# Patient Record
Sex: Female | Born: 1960 | Race: White | Hispanic: No | Marital: Married | Smoking: Never smoker
Health system: Southern US, Community
[De-identification: ages and names within clinical notes are randomized; demographics above are authoritative.]

## PROBLEM LIST (undated history)

## (undated) DIAGNOSIS — D649 Anemia, unspecified: Secondary | ICD-10-CM

## (undated) DIAGNOSIS — E119 Type 2 diabetes mellitus without complications: Secondary | ICD-10-CM

## (undated) DIAGNOSIS — C801 Malignant (primary) neoplasm, unspecified: Secondary | ICD-10-CM

## (undated) DIAGNOSIS — G4730 Sleep apnea, unspecified: Secondary | ICD-10-CM

## (undated) DIAGNOSIS — E039 Hypothyroidism, unspecified: Secondary | ICD-10-CM

## (undated) DIAGNOSIS — R51 Headache: Secondary | ICD-10-CM

## (undated) DIAGNOSIS — I10 Essential (primary) hypertension: Secondary | ICD-10-CM

## (undated) HISTORY — DX: Malignant (primary) neoplasm, unspecified: C80.1

## (undated) HISTORY — PX: TONSILLECTOMY: SUR1361

## (undated) HISTORY — DX: Sleep apnea, unspecified: G47.30

## (undated) HISTORY — PX: OTHER SURGICAL HISTORY: SHX169

## (undated) HISTORY — DX: Type 2 diabetes mellitus without complications: E11.9

## (undated) HISTORY — DX: Anemia, unspecified: D64.9

## (undated) HISTORY — PX: TUBAL LIGATION: SHX77

## (undated) HISTORY — DX: Essential (primary) hypertension: I10

## (undated) HISTORY — PX: ABDOMINAL HYSTERECTOMY: SHX81

---

## 2013-02-08 ENCOUNTER — Ambulatory Visit (INDEPENDENT_AMBULATORY_CARE_PROVIDER_SITE_OTHER): Payer: 59 | Admitting: Surgery

## 2013-02-08 ENCOUNTER — Encounter (INDEPENDENT_AMBULATORY_CARE_PROVIDER_SITE_OTHER): Payer: Self-pay | Admitting: Surgery

## 2013-02-08 ENCOUNTER — Other Ambulatory Visit (INDEPENDENT_AMBULATORY_CARE_PROVIDER_SITE_OTHER): Payer: Self-pay

## 2013-02-08 DIAGNOSIS — E119 Type 2 diabetes mellitus without complications: Secondary | ICD-10-CM

## 2013-02-08 DIAGNOSIS — G4733 Obstructive sleep apnea (adult) (pediatric): Secondary | ICD-10-CM

## 2013-02-08 DIAGNOSIS — I10 Essential (primary) hypertension: Secondary | ICD-10-CM

## 2013-02-08 DIAGNOSIS — Z6841 Body Mass Index (BMI) 40.0 and over, adult: Secondary | ICD-10-CM

## 2013-02-08 NOTE — Progress Notes (Signed)
Re:   Ann Hendricks DOB:   04/12/1960 MRN:   [id number]  ASSESSMENT AND PLAN: 1.  Morbid obesity  Initial weight - weight - 282, BMI - 44.8  Per the [facility], the patient is a candidate for bariatric surgery.  The patient attended our information session and reviewed the different types of bariatric surgery.    The patient is interested in the laparoscopic adjustable gastric band.  I discussed with the patient the indications and risks of lap band surgery.  The potential risks of surgery include, but are not limited to, bleeding, infection, DVT and PE, slippage and erosion of the band, open surgery, and death.  The patient understands the importance of compliance and long term follow-up with our group after surgery.  She was given literature regarding lap band surgery.  From here we'll obtain labs, x-rays, nutrition consult, and psych consult.  2.  Diabetes x 5 years  HgbA1C - 7.1 - 01/16/2013 3.  HTN 4.  Sleep apnea x 10 years  On CPAP  She sees a Dr. Alroy Dust in [place]. 5.  History of endometrial cancer - 2013 - Stage 1  Disease free 6.  Hypothyroid x 20 years  Chief Complaint  Patient presents with  . Weight Loss Surgery    initial consult / lap band   REFERRING PHYSICIAN: Noel Christmas  HISTORY OF PRESENT ILLNESS: Ann Hendricks is a 53 y.o. (DOB: [birth year])  white  female whose primary care physician is [facility], [place], [place].  FAX: [phone]) and comes to me today for weight loss surgery.  She come from [place], [place], because of her insurance.  The patient listened to on line seminar.  She and her husband complimented of the seminar. Her sister has had the lap band about one year ago and has done well with this. She has tried many diets including: Weight Watchers, TOPS, liquid protein, low calorie diet, Adkins diet, and Adipex. She \\has  even tried hypnosis.  She mentioned that her first diet was when she was 53 yo in the  late 1970's.  She has to complete a 6 month supervised diet by her insurer before surgery.   Past Medical History  Diagnosis Date  . Anemia   . Cancer     endometrial  . Diabetes mellitus without complication   . Hypertension   . Sleep apnea      Past Surgical History  Procedure Laterality Date  . Tonsillectomy    . Tubal ligation    . Abdominal hysterectomy      Current Outpatient Prescriptions  Medication Sig Dispense Refill  . amLODipine (NORVASC) 10 MG tablet       . aspirin 81 MG tablet Take 81 mg by mouth daily.      Marland Kitchen FREESTYLE INSULINX TEST test strip       . glyBURIDE micronized (GLYNASE) 3 MG tablet       . levothyroxine (SYNTHROID, LEVOTHROID) 200 MCG tablet       . losartan (COZAAR) 50 MG tablet       . metFORMIN (GLUCOPHAGE) 500 MG tablet       . norethindrone (AYGESTIN) 5 MG tablet       . PARoxetine (PAXIL) 20 MG tablet        No current facility-administered medications for this visit.     Allergies  Allergen Reactions  . Penicillins Hives    REVIEW OF SYSTEMS: Skin:  No history of rash.  No history of abnormal moles. Infection:  No history of hepatitis or HIV.  No history of MRSA. Neurologic:  No history of stroke.  No history of seizure.  No history of headaches. Cardiac:  HTN x 10 years. Pulmonary:  OSA x 10 years.  Dr. Alroy Dust in [place] follows her.  Endocrine:  Diabetic on oral hypoglycemics x 5 years.. On thyroid replacement x 20 years. Gastrointestinal:  No history of stomach disease.  No history of liver disease.  No history of gall bladder disease.  No history of pancreas disease. Colonoscopy in 2013 which was negative. Urologic:  No history of kidney stones.  No history of bladder infections. GYN:  Robotic hysterectomy 2013 for Stage 1 endometrial cancer.  Her husband's vasectomy did not work, so that is the reason for the trailing two children.  She has recently (last month) been started on hormone replacement because of hot  flashes. Musculoskeletal:  History of plantar fasciitis, which is behaving right now. Hematologic:  No bleeding disorder.  No history of anemia.  Not anticoagulated. Psycho-social:  The patient is oriented.   The patient has no obvious psychologic or social impairment to understanding our conversation and plan.  SOCIAL and FAMILY HISTORY: Married.  Her husband is with her. 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    PHYSICAL EXAM: BP 130/82  Pulse 80  Temp(Src) 98.4 F (36.9 C) (Oral)  Resp 15  Ht 5' 6.5" (1.689 m)  Wt 282 lb (127.914 kg)  BMI 44.84 kg/m2  General: Obese WF who is alert and generally healthy appearing.  HEENT: Normal. Pupils equal. Neck: Supple. No mass.  No thyroid mass. Lymph Nodes:  No supraclavicular or cervical nodes. Lungs: Clear to auscultation and symmetric breath sounds. Heart:  RRR. No murmur or rub. Breast:  Right - slightly smaller than the left.  No mass  Left - no mass  Abdomen: Soft. No mass. No tenderness. No hernia. Obese.  Small umbilical hernia.  Midline scar just below the umbilicus and trocar scars right mid abdomen.  She is more apple that pear. Rectal: Not done. Extremities:  Good strength and ROM  in upper and lower extremities. Neurologic:  Grossly intact to motor and sensory function. Psychiatric: Has normal mood and affect. Behavior is normal.   DATA REVIEWED: Labs from [place].  Alphonsa Overall, MD,  [facility], [address]  [place], [place]    [place] Phone:  [phone] FAX:  [phone]

## 2013-03-04 ENCOUNTER — Ambulatory Visit (HOSPITAL_COMMUNITY)
Admission: RE | Admit: 2013-03-04 | Discharge: 2013-03-04 | Disposition: A | Discharge status: Home / Self Care | Payer: 59 | Source: Ambulatory Visit | Attending: Surgery | Admitting: Surgery

## 2013-03-04 ENCOUNTER — Other Ambulatory Visit: Payer: Self-pay

## 2013-03-04 DIAGNOSIS — R1013 Epigastric pain: Secondary | ICD-10-CM

## 2013-03-04 DIAGNOSIS — G4730 Sleep apnea, unspecified: Secondary | ICD-10-CM | POA: Insufficient documentation

## 2013-03-04 DIAGNOSIS — E119 Type 2 diabetes mellitus without complications: Secondary | ICD-10-CM | POA: Insufficient documentation

## 2013-03-04 DIAGNOSIS — I10 Essential (primary) hypertension: Secondary | ICD-10-CM | POA: Insufficient documentation

## 2013-03-04 DIAGNOSIS — K224 Dyskinesia of esophagus: Secondary | ICD-10-CM | POA: Insufficient documentation

## 2013-03-04 DIAGNOSIS — K5710 Diverticulosis of small intestine without perforation or abscess without bleeding: Secondary | ICD-10-CM | POA: Insufficient documentation

## 2013-03-04 DIAGNOSIS — Z6841 Body Mass Index (BMI) 40.0 and over, adult: Secondary | ICD-10-CM | POA: Insufficient documentation

## 2013-03-04 DIAGNOSIS — E039 Hypothyroidism, unspecified: Secondary | ICD-10-CM | POA: Insufficient documentation

## 2013-03-04 DIAGNOSIS — K3189 Other diseases of stomach and duodenum: Secondary | ICD-10-CM | POA: Insufficient documentation

## 2013-03-06 ENCOUNTER — Ambulatory Visit (HOSPITAL_COMMUNITY): Admission: RE | Admit: 2013-03-06 | Payer: 59 | Source: Ambulatory Visit | Admitting: Surgery

## 2013-03-06 ENCOUNTER — Encounter (HOSPITAL_COMMUNITY): Admission: RE | Payer: Self-pay | Source: Ambulatory Visit

## 2013-03-06 SURGERY — BREATH TEST, FOR HELICOBACTER PYLORI

## 2013-03-16 ENCOUNTER — Ambulatory Visit: Payer: Self-pay | Admitting: Dietician

## 2013-03-27 ENCOUNTER — Ambulatory Visit: Payer: Self-pay | Admitting: Dietician

## 2013-04-02 ENCOUNTER — Encounter: Payer: Self-pay | Admitting: Dietician

## 2013-04-02 ENCOUNTER — Encounter: Payer: 59 | Attending: Surgery | Admitting: Dietician

## 2013-04-02 DIAGNOSIS — Z713 Dietary counseling and surveillance: Secondary | ICD-10-CM | POA: Insufficient documentation

## 2013-04-02 NOTE — Patient Instructions (Signed)
Patient to call the [facility] to enroll in Pre-Op and Post-Op Nutrition Education when surgery date is scheduled. 

## 2013-04-02 NOTE — Progress Notes (Signed)
  Pre-Op Assessment Visit:  Pre-Operative LAGB Surgery  Medical Nutrition Therapy:  Appt start time: 7858   End time:  8502.  Patient was seen on 04/02/2013 for Pre-Operative LAGB Nutrition Assessment. Assessment and letter of approval faxed to [facility] Bariatric Surgery Program coordinator on 04/02/2013.   Preferred Learning Style:   No preference indicated   Learning Readiness:   Contemplating  Handouts given during visit include:  Pre-Op Goals Bariatric Surgery Protein Shakes Pre-op diet (patient requested)  Teaching Method Utilized:  Visual Auditory Hands on  Barriers to learning/adherence to lifestyle change: none  Demonstrated degree of understanding via:  Teach Back   Patient to call the [facility] to enroll in Pre-Op and Post-Op Nutrition Education when surgery date is scheduled.

## 2013-05-08 ENCOUNTER — Telehealth (INDEPENDENT_AMBULATORY_CARE_PROVIDER_SITE_OTHER): Payer: Self-pay

## 2013-05-08 ENCOUNTER — Other Ambulatory Visit (INDEPENDENT_AMBULATORY_CARE_PROVIDER_SITE_OTHER): Payer: Self-pay

## 2013-05-08 NOTE — Telephone Encounter (Signed)
Lashawna from [facility] called stating pts h pylori test will need to be re done because the tube was open when they received it. She will call pt and let her know and set up a repeat test. New order placed.

## 2013-05-08 NOTE — Telephone Encounter (Signed)
1st breath tek had a problem with test , repeated Breath tek , results are negative on  05-08-13, we do not another Breath tek . Canceled order with [facility] for today

## 2013-06-17 ENCOUNTER — Encounter: Payer: 59 | Attending: Surgery

## 2013-06-17 DIAGNOSIS — Z713 Dietary counseling and surveillance: Secondary | ICD-10-CM | POA: Insufficient documentation

## 2013-06-18 ENCOUNTER — Other Ambulatory Visit (INDEPENDENT_AMBULATORY_CARE_PROVIDER_SITE_OTHER): Payer: Self-pay | Admitting: Surgery

## 2013-06-18 NOTE — Progress Notes (Signed)
Surgery on 07/02/13.  Preop on 06/19/13 at 0930am.  Need orders in EPIC.  Thank You.

## 2013-06-19 ENCOUNTER — Ambulatory Visit (INDEPENDENT_AMBULATORY_CARE_PROVIDER_SITE_OTHER): Payer: 59 | Admitting: Surgery

## 2013-06-19 ENCOUNTER — Encounter (HOSPITAL_COMMUNITY): Payer: Self-pay | Admitting: Pharmacy Technician

## 2013-06-19 ENCOUNTER — Encounter (INDEPENDENT_AMBULATORY_CARE_PROVIDER_SITE_OTHER): Payer: Self-pay

## 2013-06-19 ENCOUNTER — Encounter (HOSPITAL_COMMUNITY)
Admission: RE | Admit: 2013-06-19 | Discharge: 2013-06-19 | Disposition: A | Discharge status: Home / Self Care | Payer: 59 | Source: Ambulatory Visit | Attending: Surgery | Admitting: Surgery

## 2013-06-19 ENCOUNTER — Encounter (HOSPITAL_COMMUNITY): Payer: Self-pay

## 2013-06-19 ENCOUNTER — Ambulatory Visit (HOSPITAL_COMMUNITY)
Admission: RE | Admit: 2013-06-19 | Discharge: 2013-06-19 | Disposition: A | Discharge status: Home / Self Care | Payer: 59 | Source: Ambulatory Visit | Attending: Anesthesiology | Admitting: Anesthesiology

## 2013-06-19 DIAGNOSIS — I10 Essential (primary) hypertension: Secondary | ICD-10-CM | POA: Insufficient documentation

## 2013-06-19 DIAGNOSIS — E039 Hypothyroidism, unspecified: Secondary | ICD-10-CM | POA: Insufficient documentation

## 2013-06-19 DIAGNOSIS — E119 Type 2 diabetes mellitus without complications: Secondary | ICD-10-CM | POA: Insufficient documentation

## 2013-06-19 DIAGNOSIS — K439 Ventral hernia without obstruction or gangrene: Secondary | ICD-10-CM

## 2013-06-19 HISTORY — DX: Hypothyroidism, unspecified: E03.9

## 2013-06-19 HISTORY — DX: Headache: R51

## 2013-06-19 LAB — COMPREHENSIVE METABOLIC PANEL
ALT: 24 U/L (ref 0–35)
AST: 19 U/L (ref 0–37)
Albumin: 4.3 g/dL (ref 3.5–5.2)
Alkaline Phosphatase: 62 U/L (ref 39–117)
BILIRUBIN TOTAL: 0.5 mg/dL (ref 0.3–1.2)
BUN: 14 mg/dL (ref 6–23)
CHLORIDE: 98 meq/L (ref 96–112)
CO2: 23 meq/L (ref 19–32)
Calcium: 9.5 mg/dL (ref 8.4–10.5)
Creatinine, Ser: 0.56 mg/dL (ref 0.50–1.10)
GFR calc non Af Amer: >90 mL/min (ref >90)
Glucose, Bld: 126 mg/dL — ABNORMAL HIGH (ref 70–99)
Potassium: 3.9 mEq/L (ref 3.7–5.3)
Sodium: 137 mEq/L (ref 137–147)
Total Protein: 7.4 g/dL (ref 6.0–8.3)

## 2013-06-19 LAB — CBC WITH DIFFERENTIAL/PLATELET
BASOS PCT: 1 % (ref 0–1)
Basophils Absolute: 0 10*3/uL (ref 0.0–0.1)
Eosinophils Absolute: 0.1 10*3/uL (ref 0.0–0.7)
Eosinophils Relative: 1 % (ref 0–5)
HEMATOCRIT: 39.3 % (ref 36.0–46.0)
HEMOGLOBIN: 14.1 g/dL (ref 12.0–15.0)
LYMPHS PCT: 34 % (ref 12–46)
Lymphs Abs: 2.8 10*3/uL (ref 0.7–4.0)
MCH: 30 pg (ref 26.0–34.0)
MCHC: 35.9 g/dL (ref 30.0–36.0)
MCV: 83.6 fL (ref 78.0–100.0)
MONO ABS: 0.7 10*3/uL (ref 0.1–1.0)
Monocytes Relative: 9 % (ref 3–12)
NEUTROS ABS: 4.7 10*3/uL (ref 1.7–7.7)
Neutrophils Relative %: 55 % (ref 43–77)
Platelets: 314 10*3/uL (ref 150–400)
RBC: 4.7 MIL/uL (ref 3.87–5.11)
RDW: 13.6 % (ref 11.5–15.5)
WBC: 8.4 10*3/uL (ref 4.0–10.5)

## 2013-06-19 MED ORDER — HYDROCODONE-ACETAMINOPHEN 7.5-325 MG/15ML PO SOLN: 10.0000 mL | Freq: Four times a day (QID) | ORAL | Status: DC | PRN

## 2013-06-19 NOTE — Progress Notes (Signed)
Re:   Ann Hendricks DOB:   April 21, 1960 MRN:   [id number]  ASSESSMENT AND PLAN: 1.  Morbid obesity  Initial weight - weight - 282, BMI - 44.8  Per the [facility], the patient is a candidate for bariatric surgery.  The patient attended our information session and reviewed the different types of bariatric surgery.    The patient is interested in the laparoscopic adjustable gastric band.  I discussed with the patient the indications and risks of lap band surgery.  The potential risks of surgery include, but are not limited to, bleeding, infection, DVT and PE, slippage and erosion of the band, open surgery, and death.  The patient understands the importance of compliance and long term follow-up with our group after surgery.  She was given literature regarding lap band surgery.  From here we'll obtain labs, x-rays, nutrition consult, and psych consult.  2.  Diabetes x 5 years  HgbA1C - 7.1 - 01/16/2013 3.  HTN 4.  Sleep apnea x 10 years  On CPAP  She sees a Dr. Alroy Dust in [place]. 5.  History of endometrial cancer - 2013 - Stage 1  Disease free 6.  Hypothyroid x 20 years  Chief Complaint  Patient presents with  . Bariatric Follow Up    lap band   REFERRING PHYSICIAN: Noel Christmas, MD  HISTORY OF PRESENT ILLNESS: Ann Hendricks is a 53 y.o. (DOB: 09/26/1960)  white  female whose primary care physician is BURRI, Estelle Grumbles, MD [facility], [place], [place].  FAX: [phone]) and comes to me today for weight loss surgery.  She come from [place], [place], because of her insurance.  The patient listened to on line seminar.  She and her husband complimented of the seminar. Her sister has had the lap band about one year ago and has done well with this. She has tried many diets including: Weight Watchers, TOPS, liquid protein, low calorie diet, Adkins diet, and Adipex. She \has even tried hypnosis.  She mentioned that her first diet was when she was 53 yo in the late  1970's.  She has to complete a 6 month supervised diet by her insurer before surgery.  A cause of benign healthcare rules the patient's ensure required that I do her surgery. I met with her and her husband and discussed lap band surgery specifically. We discussed postoperative care and preoperative care. Her sister had a LAP-BAND has lost over 100 pounds in the last year. She's done well. I think the Claw has good insight and is ready to have the surgery. She is staying on her preoperative diet. She does not have reflux an upper GI did not show any evidence of hiatal hernia. I gave her prescription for Hycet.     Past Medical History  Diagnosis Date  . Cancer     endometrial  . Hypertension   . Sleep apnea     USES CPAP SETTING 7  . Diabetes mellitus without complication     ORAL MEDS - NO INSULIN  . Hypothyroidism   . Headache(784.0)     OCAS MIGRAINE  . Anemia     IN THE PAST     Past Surgical History  Procedure Laterality Date  . Tubal ligation    . Abdominal hysterectomy    . Tonsillectomy      AS A CHILD  . Wisdom teeth removed and some other teeth extractions      Current Outpatient Prescriptions  Medication Sig  Dispense Refill  . amLODipine (NORVASC) 10 MG tablet Take 10 mg by mouth every evening.       Marland Kitchen aspirin EC 81 MG tablet Take 81 mg by mouth every evening.      Marland Kitchen aspirin-acetaminophen-caffeine (EXCEDRIN MIGRAINE) 250-250-65 MG per tablet Take 1 tablet by mouth every 6 (six) hours as needed for headache.      . escitalopram (LEXAPRO) 10 MG tablet Take 10 mg by mouth every evening.      Marland Kitchen FREESTYLE INSULINX TEST test strip       . glyBURIDE micronized (GLYNASE) 3 MG tablet Take 3 mg by mouth 2 (two) times daily.       Marland Kitchen ibuprofen (ADVIL,MOTRIN) 200 MG tablet Take 200 mg by mouth every 6 (six) hours as needed (Pain).      Marland Kitchen levothyroxine (SYNTHROID, LEVOTHROID) 200 MCG tablet Take 200 mcg by mouth every evening.      Marland Kitchen losartan (COZAAR) 50 MG tablet Take 50 mg by  mouth every evening.       . metFORMIN (GLUCOPHAGE) 500 MG tablet Take 1,000 mg by mouth 2 (two) times daily.       Marland Kitchen HYDROcodone-acetaminophen (HYCET) 7.5-325 mg/15 ml solution Take 10 mLs by mouth 4 (four) times daily as needed for moderate pain.  120 mL  0  . norethindrone (AYGESTIN) 5 MG tablet Take 5 mg by mouth daily.        No current facility-administered medications for this visit.     Allergies  Allergen Reactions  . Penicillins Hives    REVIEW OF SYSTEMS: Skin:  No history of rash.  No history of abnormal moles. Infection:  No history of hepatitis or HIV.  No history of MRSA. Neurologic:  No history of stroke.  No history of seizure.  No history of headaches. Cardiac:  HTN x 10 years. Pulmonary:  OSA x 10 years.  Dr. Alroy Dust in [place] follows her.  Endocrine:  Diabetic on oral hypoglycemics x 5 years.. On thyroid replacement x 20 years. Gastrointestinal:  No history of stomach disease.  No history of liver disease.  No history of gall bladder disease.  No history of pancreas disease. Colonoscopy in 2013 which was negative. Urologic:  No history of kidney stones.  No history of bladder infections. GYN:  Robotic hysterectomy 2013 for Stage 1 endometrial cancer.  Her husband's vasectomy did not work, so that is the reason for the trailing two children.  She has recently (last month) been started on hormone replacement because of hot flashes.  She stopped them as of a few days ago.   Musculoskeletal:  History of plantar fasciitis, which is behaving right now. Hematologic:  No bleeding disorder.  No history of anemia.  Not anticoagulated. Psycho-social:  The patient is oriented.   The patient has no obvious psychologic or social impairment to understanding our conversation and plan.  SOCIAL and FAMILY HISTORY: Married.  Her husband is with her. 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    PHYSICAL EXAM: BP 128/76  Pulse 77  Temp(Src) 98 F (36.7 C)   Resp 18  Ht 5' 6.5" (1.689 m)  Wt 275 lb 12.8 oz (125.102 kg)  BMI 43.85 kg/m2  General: Obese WF who is alert and generally healthy appearing.  HEENT: Normal. Pupils equal. Neck: Supple. No mass.  No thyroid mass. Lymph Nodes:  No supraclavicular or cervical nodes. Lungs: Clear to auscultation and symmetric breath sounds. Heart:  RRR.  No murmur or rub. Breast:  Right - slightly smaller than the left.  No mass  Left - no mass  Abdomen: Soft. No mass. No tenderness. No hernia. Obese.  Small umbilical hernia.  Midline scar just below the umbilicus and trocar scars right mid abdomen.   Rectal: Not done. Extremities:  Good strength and ROM  in upper and lower extremities. Neurologic:  Grossly intact to motor and sensory function. Psychiatric: Has normal mood and affect. Behavior is normal.   DATA REVIEWED: Labs from [place].  A/P:  Morbid obesity and DM;  Plan lapband placement at [facility].    Matt B. Hassell Done, MD, [facility] [phone] beeper [phone]  06/19/2013 12:52 PM

## 2013-06-19 NOTE — Pre-Procedure Instructions (Signed)
EKG REPORT IN EPIC FROM 03/04/13. CXR WAS DONE TODAY PREOP AT [facility]. NOTE FAXED IN EPIC TO DR. [name] THAT PT REPORTS ALLERGY TO PENICILLIN - HIVES AND CEFOXITIN IS ORDERED PREOP.

## 2013-06-19 NOTE — Progress Notes (Signed)
  Pre-Operative Nutrition Class:  Appt start time: 1610   End time:  1830.  Patient was seen on 06/17/2013 for Pre-Operative Bariatric Surgery Education at the [facility].   Surgery date: 07/02/2013 Surgery type: LAGB Start weight at [facility]: 280.5 lbs Weight today: 279 lbs  [facility]  BODY COMP RESULTS  06/17/2013   BMI (kg/m^2) 44.4   Fat Mass (lbs) 138.5   Fat Free Mass (lbs) 140.5   Total Body Water (lbs) 103   Samples given per MNT protocol. Patient educated on appropriate usage: Unjury protein powder (strawberry - qty 1) Lot #: [id number] Exp: 03/2014  Premier protein shake (vanilla - qty 1) Lot #: [id number] Exp: 10/2013  Celebrate Vitamins Multivitamin (mandarin orange - qty 1) Lot #: [id number] Exp: 06/2014  Bariatric Advantage Calcium Citrate (cinnamon - qty 1) Lot #: [id number] Exp: 08/2013  The following the learning objectives were met by the patient during this course:  Identify Pre-Op Dietary Goals and will begin 2 weeks pre-operatively  Identify appropriate sources of fluids and proteins   State protein recommendations and appropriate sources pre and post-operatively  Identify Post-Operative Dietary Goals and will follow for 2 weeks post-operatively  Identify appropriate multivitamin and calcium sources  Describe the need for physical activity post-operatively and will follow MD recommendations  State when to call healthcare provider regarding medication questions or post-operative complications  Handouts given during class include:  Pre-Op Bariatric Surgery Diet Handout  Protein Shake Handout  Post-Op Bariatric Surgery Nutrition Handout  BELT Program Information Flyer  Support Group Information Flyer  [facility] Bariatric Supplements Price List  Follow-Up Plan: Patient will follow-up at [facility] 2 weeks post operatively for diet advancement per MD.

## 2013-06-19 NOTE — Patient Instructions (Signed)

## 2013-06-19 NOTE — Patient Instructions (Signed)
YOUR SURGERY IS SCHEDULED AT [facility]  ON:  Tuesday  6/23  REPORT TO  SHORT STAY CENTER AT:  8:15 AM   PLEASE COME IN THE [place] ENTRANCE AND FOLLOW SIGNS TO SHORT STAY CENTER.  DO NOT EAT OR DRINK ANYTHING AFTER MIDNIGHT THE NIGHT BEFORE YOUR SURGERY.  YOU MAY BRUSH YOUR TEETH, RINSE OUT YOUR MOUTH--BUT NO WATER, NO FOOD, NO CHEWING GUM, NO MINTS, NO CANDIES, NO CHEWING TOBACCO.  PLEASE TAKE THE FOLLOWING MEDICATIONS THE AM OF YOUR SURGERY WITH A FEW SIPS OF WATER:  NO MEDICATIONS TO TAKE AM OF SURGERY   IF YOU ARE DIABETIC:  DO NOT TAKE ANY DIABETIC MEDICATIONS THE AM OF YOUR SURGERY.    IF YOU HAVE SLEEP APNEA AND USE CPAP OR BIPAP--PLEASE BRING THE MASK AND THE TUBING.  DO NOT BRING YOUR MACHINE.  DO NOT BRING VALUABLES, MONEY, CREDIT CARDS.  DO NOT WEAR JEWELRY, MAKE-UP, NAIL POLISH AND NO METAL PINS OR CLIPS IN YOUR HAIR. CONTACT LENS, DENTURES / PARTIALS, GLASSES SHOULD NOT BE WORN TO SURGERY AND IN MOST CASES-HEARING AIDS WILL NEED TO BE REMOVED.  BRING YOUR GLASSES CASE, ANY EQUIPMENT NEEDED FOR YOUR CONTACT LENS. FOR PATIENTS ADMITTED TO THE HOSPITAL--CHECK OUT TIME THE DAY OF DISCHARGE IS 11:00 AM.  ALL INPATIENT ROOMS ARE PRIVATE - WITH BATHROOM, TELEPHONE, TELEVISION AND WIFI INTERNET.  IF YOU ARE BEING DISCHARGED THE SAME DAY OF YOUR SURGERY--YOU CAN NOT DRIVE YOURSELF HOME--AND SHOULD NOT GO HOME ALONE BY TAXI OR BUS.  NO DRIVING OR OPERATING MACHINERY, OR MAKING LEGAL DECISIONS FOR 24 HOURS FOLLOWING ANESTHESIA / PAIN MEDICATIONS.  PLEASE MAKE ARRANGEMENTS FOR SOMEONE TO BE WITH YOU AT HOME THE FIRST 24 HOURS AFTER SURGERY. RESPONSIBLE DRIVER'S NAME / PHONE                                                   PLEASE READ OVER ANY  FACT SHEETS THAT YOU WERE GIVEN: MRSA INFORMATION, BLOOD TRANSFUSION INFORMATION, INCENTIVE SPIROMETER INFORMATION.  PLEASE BE AWARE THAT YOU MAY NEED ADDITIONAL BLOOD DRAWN DAY OF YOUR  SURGERY  _______________________________________________________________________   [facility] - Preparing for Surgery Before surgery, you can play an important role.  Because skin is not sterile, your skin needs to be as free of germs as possible.  You can reduce the number of germs on your skin by washing with CHG (chlorahexidine gluconate) soap before surgery.  CHG is an antiseptic cleaner which kills germs and bonds with the skin to continue killing germs even after washing. Please DO NOT use if you have an allergy to CHG or antibacterial soaps.  If your skin becomes reddened/irritated stop using the CHG and inform your nurse when you arrive at Short Stay. Do not shave (including legs and underarms) for at least 48 hours prior to the first CHG shower.  You may shave your face/neck. Please follow these instructions carefully:  1.  Shower with CHG Soap the night before surgery and the  morning of Surgery.  2.  If you choose to wash your hair, wash your hair first as usual with your  normal  shampoo.  3.  After you shampoo, rinse your hair and body thoroughly to remove the  shampoo.  4.  Use CHG as you would any other liquid soap.  You can apply chg directly  to the skin and wash                       Gently with a scrungie or clean washcloth.  5.  Apply the CHG Soap to your body ONLY FROM THE NECK DOWN.   Do not use on face/ open                           Wound or open sores. Avoid contact with eyes, ears mouth and genitals (private parts).                       Wash face,  Genitals (private parts) with your normal soap.             6.  Wash thoroughly, paying special attention to the area where your surgery  will be performed.  7.  Thoroughly rinse your body with warm water from the neck down.  8.  DO NOT shower/wash with your normal soap after using and rinsing off  the CHG Soap.                9.  Pat yourself dry with a clean towel.            10.  Wear clean  pajamas.            11.  Place clean sheets on your bed the night of your first shower and do not  sleep with pets. Day of Surgery : Do not apply any lotions/deodorants the morning of surgery.  Please wear clean clothes to the hospital/surgery center.  FAILURE TO FOLLOW THESE INSTRUCTIONS MAY RESULT IN THE CANCELLATION OF YOUR SURGERY PATIENT SIGNATURE_________________________________  NURSE SIGNATURE__________________________________  ________________________________________________________________________

## 2013-06-19 NOTE — Progress Notes (Signed)
DR. Hassell Done - Ann Hendricks REPORTS ALLERGY TO PENICILLIN - HIVES.  DO YOU STILL WANT TO GIVE HER THE CEFOXITIN PREOP?   IF YOU WANT TO GIVE A DIFFERENT ANTIBIOTIC - PLEASE ENTER NEW ORDER IN EPIC AND CANCEL THE CEFOXITIN.   SHE HAS APPOINTMENT TO SEE YOU TODAY- SHE CAME TO [facility] THIS AM FOR HER PREOP APPOINTMENT / LABS AND CXR.

## 2013-07-02 ENCOUNTER — Encounter (HOSPITAL_COMMUNITY)
Admission: RE | Disposition: A | Discharge status: Home / Self Care | Payer: Self-pay | Source: Ambulatory Visit | Attending: Surgery

## 2013-07-02 ENCOUNTER — Ambulatory Visit (INDEPENDENT_AMBULATORY_CARE_PROVIDER_SITE_OTHER): Payer: 59 | Admitting: Surgery

## 2013-07-02 ENCOUNTER — Observation Stay (HOSPITAL_COMMUNITY)
Admission: RE | Admit: 2013-07-02 | Discharge: 2013-07-03 | Disposition: A | Discharge status: Home / Self Care | Payer: 59 | Source: Ambulatory Visit | Attending: Surgery | Admitting: Surgery

## 2013-07-02 ENCOUNTER — Encounter (HOSPITAL_COMMUNITY): Payer: Self-pay | Admitting: *Deleted

## 2013-07-02 ENCOUNTER — Ambulatory Visit (HOSPITAL_COMMUNITY): Payer: 59 | Admitting: Anesthesiology

## 2013-07-02 ENCOUNTER — Encounter (HOSPITAL_COMMUNITY): Payer: 59 | Admitting: Anesthesiology

## 2013-07-02 DIAGNOSIS — Z8542 Personal history of malignant neoplasm of other parts of uterus: Secondary | ICD-10-CM | POA: Insufficient documentation

## 2013-07-02 DIAGNOSIS — Z6841 Body Mass Index (BMI) 40.0 and over, adult: Secondary | ICD-10-CM

## 2013-07-02 DIAGNOSIS — Z79899 Other long term (current) drug therapy: Secondary | ICD-10-CM | POA: Insufficient documentation

## 2013-07-02 DIAGNOSIS — E119 Type 2 diabetes mellitus without complications: Secondary | ICD-10-CM

## 2013-07-02 DIAGNOSIS — D649 Anemia, unspecified: Secondary | ICD-10-CM | POA: Insufficient documentation

## 2013-07-02 DIAGNOSIS — E039 Hypothyroidism, unspecified: Secondary | ICD-10-CM | POA: Insufficient documentation

## 2013-07-02 DIAGNOSIS — Z9884 Bariatric surgery status: Secondary | ICD-10-CM

## 2013-07-02 DIAGNOSIS — G4730 Sleep apnea, unspecified: Secondary | ICD-10-CM | POA: Insufficient documentation

## 2013-07-02 DIAGNOSIS — Z90710 Acquired absence of both cervix and uterus: Secondary | ICD-10-CM | POA: Insufficient documentation

## 2013-07-02 DIAGNOSIS — I10 Essential (primary) hypertension: Secondary | ICD-10-CM | POA: Insufficient documentation

## 2013-07-02 HISTORY — PX: LAPAROSCOPIC GASTRIC BANDING: SHX1100

## 2013-07-02 LAB — CBC
HCT: 38.1 % (ref 36.0–46.0)
Hemoglobin: 13.2 g/dL (ref 12.0–15.0)
MCH: 29.4 pg (ref 26.0–34.0)
MCHC: 34.6 g/dL (ref 30.0–36.0)
MCV: 84.9 fL (ref 78.0–100.0)
PLATELETS: 276 10*3/uL (ref 150–400)
RBC: 4.49 MIL/uL (ref 3.87–5.11)
RDW: 13.8 % (ref 11.5–15.5)
WBC: 14.3 10*3/uL — ABNORMAL HIGH (ref 4.0–10.5)

## 2013-07-02 LAB — CREATININE, SERUM
CREATININE: 0.61 mg/dL (ref 0.50–1.10)
GFR calc Af Amer: >90 mL/min (ref >90)
GFR calc non Af Amer: >90 mL/min (ref >90)

## 2013-07-02 LAB — GLUCOSE, CAPILLARY
GLUCOSE-CAPILLARY: 114 mg/dL — AB (ref 70–99)
GLUCOSE-CAPILLARY: 135 mg/dL — AB (ref 70–99)

## 2013-07-02 SURGERY — GASTRIC BANDING, LAPAROSCOPIC
Anesthesia: General

## 2013-07-02 MED ORDER — SUCCINYLCHOLINE CHLORIDE 20 MG/ML IJ SOLN
INTRAMUSCULAR | Status: DC | PRN
  Administered 2013-07-02: 160 mg via INTRAVENOUS
  Administered 2013-07-02: 60 mg via INTRAVENOUS

## 2013-07-02 MED ORDER — HYDROMORPHONE HCL PF 1 MG/ML IJ SOLN
INTRAMUSCULAR | Status: AC
  Filled 2013-07-02: qty 1

## 2013-07-02 MED ORDER — FENTANYL CITRATE 0.05 MG/ML IJ SOLN
INTRAMUSCULAR | Status: DC | PRN
  Administered 2013-07-02 (×2): 50 ug via INTRAVENOUS
  Administered 2013-07-02: 100 ug via INTRAVENOUS
  Administered 2013-07-02: 50 ug via INTRAVENOUS

## 2013-07-02 MED ORDER — LACTATED RINGERS IV SOLN: INTRAVENOUS | Status: DC

## 2013-07-02 MED ORDER — MIDAZOLAM HCL 5 MG/5ML IJ SOLN: INTRAMUSCULAR | Status: DC | PRN

## 2013-07-02 MED ORDER — NEOSTIGMINE METHYLSULFATE 10 MG/10ML IV SOLN
INTRAVENOUS | Status: AC
  Filled 2013-07-02: qty 1

## 2013-07-02 MED ORDER — PROPOFOL 10 MG/ML IV BOLUS
INTRAVENOUS | Status: AC
  Filled 2013-07-02: qty 20

## 2013-07-02 MED ORDER — FENTANYL CITRATE 0.05 MG/ML IJ SOLN
INTRAMUSCULAR | Status: AC
  Filled 2013-07-02: qty 5

## 2013-07-02 MED ORDER — HEPARIN SODIUM (PORCINE) 5000 UNIT/ML IJ SOLN
5000.0000 [IU] | INTRAMUSCULAR | Status: AC
  Administered 2013-07-02: 5000 [IU] via SUBCUTANEOUS
  Filled 2013-07-02: qty 1

## 2013-07-02 MED ORDER — CHLORHEXIDINE GLUCONATE CLOTH 2 % EX PADS
6.0000 | MEDICATED_PAD | Freq: Once | CUTANEOUS | Status: DC
Start: 2013-07-02 — End: 2013-07-02

## 2013-07-02 MED ORDER — HEPARIN SODIUM (PORCINE) 5000 UNIT/ML IJ SOLN
5000.0000 [IU] | Freq: Three times a day (TID) | INTRAMUSCULAR | Status: DC
  Administered 2013-07-02 – 2013-07-03 (×3): 5000 [IU] via SUBCUTANEOUS
  Filled 2013-07-02 (×5): qty 1

## 2013-07-02 MED ORDER — HYDROMORPHONE HCL PF 1 MG/ML IJ SOLN
0.2500 mg | INTRAMUSCULAR | Status: DC | PRN
  Administered 2013-07-02: 0.25 mg via INTRAVENOUS

## 2013-07-02 MED ORDER — GLYCOPYRROLATE 0.2 MG/ML IJ SOLN
INTRAMUSCULAR | Status: DC | PRN
  Administered 2013-07-02: .8 mg via INTRAVENOUS

## 2013-07-02 MED ORDER — UNJURY VANILLA POWDER: 2.0000 [oz_av] | Freq: Four times a day (QID) | ORAL | Status: DC

## 2013-07-02 MED ORDER — CHLORHEXIDINE GLUCONATE CLOTH 2 % EX PADS: 6.0000 | MEDICATED_PAD | Freq: Once | CUTANEOUS | Status: DC

## 2013-07-02 MED ORDER — UNJURY CHOCOLATE CLASSIC POWDER
2.0000 [oz_av] | Freq: Four times a day (QID) | ORAL | Status: DC
  Administered 2013-07-03 (×2): 2 [oz_av] via ORAL

## 2013-07-02 MED ORDER — NEOSTIGMINE METHYLSULFATE 10 MG/10ML IV SOLN
INTRAVENOUS | Status: DC | PRN
  Administered 2013-07-02: 5 mg via INTRAVENOUS

## 2013-07-02 MED ORDER — CISATRACURIUM BESYLATE 20 MG/10ML IV SOLN
INTRAVENOUS | Status: AC
  Filled 2013-07-02: qty 10

## 2013-07-02 MED ORDER — ACETAMINOPHEN 160 MG/5ML PO SOLN: 325.0000 mg | ORAL | Status: DC | PRN

## 2013-07-02 MED ORDER — DEXTROSE 5 % IV SOLN
INTRAVENOUS | Status: AC
  Filled 2013-07-02: qty 2

## 2013-07-02 MED ORDER — CISATRACURIUM BESYLATE (PF) 10 MG/5ML IV SOLN
INTRAVENOUS | Status: DC | PRN
  Administered 2013-07-02: 10 mg via INTRAVENOUS
  Administered 2013-07-02: 2 mg via INTRAVENOUS

## 2013-07-02 MED ORDER — CEFOXITIN SODIUM 2 G IV SOLR
2.0000 g | INTRAVENOUS | Status: AC
  Administered 2013-07-02: 2 g via INTRAVENOUS

## 2013-07-02 MED ORDER — GLYCOPYRROLATE 0.2 MG/ML IJ SOLN
INTRAMUSCULAR | Status: AC
  Filled 2013-07-02: qty 3

## 2013-07-02 MED ORDER — UNJURY CHICKEN SOUP POWDER: 2.0000 [oz_av] | Freq: Four times a day (QID) | ORAL | Status: DC

## 2013-07-02 MED ORDER — KCL IN DEXTROSE-NACL 20-5-0.45 MEQ/L-%-% IV SOLN
INTRAVENOUS | Status: DC
  Administered 2013-07-02: 14:00:00 via INTRAVENOUS
  Filled 2013-07-02 (×2): qty 1000

## 2013-07-02 MED ORDER — PROPOFOL 10 MG/ML IV BOLUS
INTRAVENOUS | Status: DC | PRN
  Administered 2013-07-02: 200 mg via INTRAVENOUS
  Administered 2013-07-02 (×2): 100 mg via INTRAVENOUS

## 2013-07-02 MED ORDER — OXYCODONE HCL 5 MG/5ML PO SOLN
5.0000 mg | ORAL | Status: DC | PRN
  Administered 2013-07-03: 5 mg via ORAL
  Filled 2013-07-02: qty 25

## 2013-07-02 MED ORDER — ACETAMINOPHEN 160 MG/5ML PO SOLN: 650.0000 mg | ORAL | Status: DC | PRN

## 2013-07-02 MED ORDER — DEXAMETHASONE SODIUM PHOSPHATE 10 MG/ML IJ SOLN
INTRAMUSCULAR | Status: AC
  Filled 2013-07-02: qty 1

## 2013-07-02 MED ORDER — ONDANSETRON HCL 4 MG/2ML IJ SOLN: 4.0000 mg | INTRAMUSCULAR | Status: DC | PRN

## 2013-07-02 MED ORDER — LIDOCAINE HCL (CARDIAC) 20 MG/ML IV SOLN
INTRAVENOUS | Status: DC | PRN
  Administered 2013-07-02: 75 mg via INTRAVENOUS

## 2013-07-02 MED ORDER — LIDOCAINE HCL (CARDIAC) 20 MG/ML IV SOLN
INTRAVENOUS | Status: AC
  Filled 2013-07-02: qty 5

## 2013-07-02 MED ORDER — LACTATED RINGERS IV SOLN
INTRAVENOUS | Status: DC | PRN
  Administered 2013-07-02 (×2): via INTRAVENOUS

## 2013-07-02 MED ORDER — MORPHINE SULFATE 2 MG/ML IJ SOLN
2.0000 mg | INTRAMUSCULAR | Status: DC | PRN
  Administered 2013-07-02 – 2013-07-03 (×4): 2 mg via INTRAVENOUS
  Filled 2013-07-02 (×3): qty 1

## 2013-07-02 MED ORDER — MIDAZOLAM HCL 2 MG/2ML IJ SOLN
INTRAMUSCULAR | Status: AC
  Filled 2013-07-02: qty 2

## 2013-07-02 MED ORDER — ONDANSETRON HCL 4 MG/2ML IJ SOLN
INTRAMUSCULAR | Status: DC | PRN
  Administered 2013-07-02 (×2): 2 mg via INTRAVENOUS

## 2013-07-02 MED ORDER — BUPIVACAINE LIPOSOME 1.3 % IJ SUSP
20.0000 mL | Freq: Once | INTRAMUSCULAR | Status: DC
  Filled 2013-07-02: qty 20

## 2013-07-02 MED ORDER — PROMETHAZINE HCL 25 MG/ML IJ SOLN: 6.2500 mg | INTRAMUSCULAR | Status: DC | PRN

## 2013-07-02 MED ORDER — SODIUM CHLORIDE 0.9 % IJ SOLN
INTRAMUSCULAR | Status: AC
  Filled 2013-07-02: qty 30

## 2013-07-02 MED ORDER — SODIUM CHLORIDE 0.9 % IJ SOLN
INTRAMUSCULAR | Status: DC | PRN
  Administered 2013-07-02: 20 mL via INTRAVENOUS

## 2013-07-02 MED ORDER — MIDAZOLAM HCL 5 MG/5ML IJ SOLN
INTRAMUSCULAR | Status: DC | PRN
  Administered 2013-07-02 (×2): 1 mg via INTRAVENOUS

## 2013-07-02 MED ORDER — BUPIVACAINE LIPOSOME 1.3 % IJ SUSP
INTRAMUSCULAR | Status: DC | PRN
  Administered 2013-07-02: 20 mL

## 2013-07-02 MED ORDER — ONDANSETRON HCL 4 MG/2ML IJ SOLN
INTRAMUSCULAR | Status: AC
  Filled 2013-07-02: qty 2

## 2013-07-02 MED ORDER — 0.9 % SODIUM CHLORIDE (POUR BTL) OPTIME
TOPICAL | Status: DC | PRN
  Administered 2013-07-02: 1000 mL

## 2013-07-02 SURGICAL SUPPLY — 51 items
BAND LAP 10.0 W/TUBES (Band) ×2 IMPLANT
BENZOIN TINCTURE PRP APPL 2/3 (GAUZE/BANDAGES/DRESSINGS) IMPLANT
BLADE SURG 15 STRL LF DISP TIS (BLADE) ×1 IMPLANT
BLADE SURG 15 STRL SS (BLADE) ×1
DECANTER SPIKE VIAL GLASS SM (MISCELLANEOUS) ×4 IMPLANT
DERMABOND ADVANCED (GAUZE/BANDAGES/DRESSINGS) ×1
DERMABOND ADVANCED .7 DNX12 (GAUZE/BANDAGES/DRESSINGS) ×1 IMPLANT
DEVICE SUT QUICK LOAD TK 5 (STAPLE) ×6 IMPLANT
DEVICE SUT TI-KNOT TK 5X26 (MISCELLANEOUS) ×2 IMPLANT
DEVICE SUTURE ENDOST 10MM (ENDOMECHANICALS) IMPLANT
DISSECTOR BLUNT TIP ENDO 5MM (MISCELLANEOUS) IMPLANT
DRAPE CAMERA CLOSED 9X96 (DRAPES) ×2 IMPLANT
ELECT REM PT RETURN 9FT ADLT (ELECTROSURGICAL) ×2
ELECTRODE REM PT RTRN 9FT ADLT (ELECTROSURGICAL) ×1 IMPLANT
GAUZE SPONGE 4X4 12PLY STRL (GAUZE/BANDAGES/DRESSINGS) ×2 IMPLANT
GLOVE BIOGEL M 8.0 STRL (GLOVE) ×2 IMPLANT
GOWN SPEC L4 XLG W/TWL (GOWN DISPOSABLE) ×2 IMPLANT
GOWN STRL REUS W/TWL XL LVL3 (GOWN DISPOSABLE) ×6 IMPLANT
HOVERMATT SINGLE USE (MISCELLANEOUS) ×2 IMPLANT
KIT BASIN OR (CUSTOM PROCEDURE TRAY) ×2 IMPLANT
MESH HERNIA 1X4 RECT BARD (Mesh General) ×1 IMPLANT
MESH HERNIA BARD 1X4 (Mesh General) ×1 IMPLANT
NEEDLE SPNL 22GX3.5 QUINCKE BK (NEEDLE) ×2 IMPLANT
PACK UNIVERSAL I (CUSTOM PROCEDURE TRAY) ×2 IMPLANT
SCISSORS LAP 5X45 EPIX DISP (ENDOMECHANICALS) IMPLANT
SET IRRIG TUBING LAPAROSCOPIC (IRRIGATION / IRRIGATOR) IMPLANT
SHEARS CURVED HARMONIC AC 45CM (MISCELLANEOUS) IMPLANT
SLEEVE ADV FIXATION 5X100MM (TROCAR) IMPLANT
SOLUTION ANTI FOG 6CC (MISCELLANEOUS) ×2 IMPLANT
SPONGE LAP 18X18 X RAY DECT (DISPOSABLE) ×2 IMPLANT
STAPLER VISISTAT 35W (STAPLE) ×2 IMPLANT
STRIP CLOSURE SKIN 1/2X4 (GAUZE/BANDAGES/DRESSINGS) IMPLANT
SUT ETHIBOND 2 0 SH (SUTURE) ×3
SUT ETHIBOND 2 0 SH 36X2 (SUTURE) ×3 IMPLANT
SUT PROLENE 2 0 CT2 30 (SUTURE) ×2 IMPLANT
SUT SILK 0 (SUTURE) ×1
SUT SILK 0 30XBRD TIE 6 (SUTURE) ×1 IMPLANT
SUT SURGIDAC NAB ES-9 0 48 120 (SUTURE) IMPLANT
SUT VIC AB 2-0 SH 27 (SUTURE)
SUT VIC AB 2-0 SH 27X BRD (SUTURE) IMPLANT
SUT VIC AB 4-0 SH 18 (SUTURE) ×2 IMPLANT
SYRINGE 20CC LL (MISCELLANEOUS) ×4 IMPLANT
TOWEL OR 17X26 10 PK STRL BLUE (TOWEL DISPOSABLE) ×4 IMPLANT
TOWEL OR NON WOVEN STRL DISP B (DISPOSABLE) ×2 IMPLANT
TROCAR ADV FIXATION 12X100MM (TROCAR) ×2 IMPLANT
TROCAR BLADELESS 15MM (ENDOMECHANICALS) ×2 IMPLANT
TROCAR BLADELESS OPT 5 100 (ENDOMECHANICALS) ×2 IMPLANT
TROCAR XCEL NON-BLD 11X100MML (ENDOMECHANICALS) IMPLANT
TROCAR XCEL UNIV SLVE 11M 100M (ENDOMECHANICALS) ×2 IMPLANT
TUBE CALIBRATION LAPBAND (TUBING) ×2 IMPLANT
TUBING INSUFFLATION 10FT LAP (TUBING) ×2 IMPLANT

## 2013-07-02 NOTE — Interval H&P Note (Signed)
History and Physical Interval Note:  07/02/2013 9:20 AM  Ann Hendricks  has presented today for surgery, with the diagnosis of morbid obesity   The various methods of treatment have been discussed with the patient and family. After consideration of risks, benefits and other options for treatment, the patient has consented to  Procedure(s): LAPAROSCOPIC GASTRIC BANDING (N/A) as a surgical intervention .  The patient's history has been reviewed, patient examined, no change in status, stable for surgery.  I have reviewed the patient's chart and labs.  Questions were answered to the patient's satisfaction.     [name],[name]

## 2013-07-02 NOTE — H&P (View-Only) (Signed)
Re:   Ann Hendricks DOB:   April 21, 1960 MRN:   [id number]  ASSESSMENT AND PLAN: 1.  Morbid obesity  Initial weight - weight - 282, BMI - 44.8  Per the [facility], the patient is a candidate for bariatric surgery.  The patient attended our information session and reviewed the different types of bariatric surgery.    The patient is interested in the laparoscopic adjustable gastric band.  I discussed with the patient the indications and risks of lap band surgery.  The potential risks of surgery include, but are not limited to, bleeding, infection, DVT and PE, slippage and erosion of the band, open surgery, and death.  The patient understands the importance of compliance and long term follow-up with our group after surgery.  She was given literature regarding lap band surgery.  From here we'll obtain labs, x-rays, nutrition consult, and psych consult.  2.  Diabetes x 5 years  HgbA1C - 7.1 - 01/16/2013 3.  HTN 4.  Sleep apnea x 10 years  On CPAP  She sees a Dr. Alroy Dust in [place]. 5.  History of endometrial cancer - 2013 - Stage 1  Disease free 6.  Hypothyroid x 20 years  Chief Complaint  Patient presents with  . Bariatric Follow Up    lap band   REFERRING PHYSICIAN: Noel Christmas, MD  HISTORY OF PRESENT ILLNESS: Ann Hendricks is a 53 y.o. (DOB: 09/26/1960)  white  female whose primary care physician is BURRI, Estelle Grumbles, MD [facility], [place], [place].  FAX: [phone]) and comes to me today for weight loss surgery.  She come from [place], [place], because of her insurance.  The patient listened to on line seminar.  She and her husband complimented of the seminar. Her sister has had the lap band about one year ago and has done well with this. She has tried many diets including: Weight Watchers, TOPS, liquid protein, low calorie diet, Adkins diet, and Adipex. She \has even tried hypnosis.  She mentioned that her first diet was when she was 53 yo in the late  1970's.  She has to complete a 6 month supervised diet by her insurer before surgery.  A cause of benign healthcare rules the patient's ensure required that I do her surgery. I met with her and her husband and discussed lap band surgery specifically. We discussed postoperative care and preoperative care. Her sister had a LAP-BAND has lost over 100 pounds in the last year. She's done well. I think the Claw has good insight and is ready to have the surgery. She is staying on her preoperative diet. She does not have reflux an upper GI did not show any evidence of hiatal hernia. I gave her prescription for Hycet.     Past Medical History  Diagnosis Date  . Cancer     endometrial  . Hypertension   . Sleep apnea     USES CPAP SETTING 7  . Diabetes mellitus without complication     ORAL MEDS - NO INSULIN  . Hypothyroidism   . Headache(784.0)     OCAS MIGRAINE  . Anemia     IN THE PAST     Past Surgical History  Procedure Laterality Date  . Tubal ligation    . Abdominal hysterectomy    . Tonsillectomy      AS A CHILD  . Wisdom teeth removed and some other teeth extractions      Current Outpatient Prescriptions  Medication Sig  Dispense Refill  . amLODipine (NORVASC) 10 MG tablet Take 10 mg by mouth every evening.       Marland Kitchen aspirin EC 81 MG tablet Take 81 mg by mouth every evening.      Marland Kitchen aspirin-acetaminophen-caffeine (EXCEDRIN MIGRAINE) 250-250-65 MG per tablet Take 1 tablet by mouth every 6 (six) hours as needed for headache.      . escitalopram (LEXAPRO) 10 MG tablet Take 10 mg by mouth every evening.      Marland Kitchen FREESTYLE INSULINX TEST test strip       . glyBURIDE micronized (GLYNASE) 3 MG tablet Take 3 mg by mouth 2 (two) times daily.       Marland Kitchen ibuprofen (ADVIL,MOTRIN) 200 MG tablet Take 200 mg by mouth every 6 (six) hours as needed (Pain).      Marland Kitchen levothyroxine (SYNTHROID, LEVOTHROID) 200 MCG tablet Take 200 mcg by mouth every evening.      Marland Kitchen losartan (COZAAR) 50 MG tablet Take 50 mg by  mouth every evening.       . metFORMIN (GLUCOPHAGE) 500 MG tablet Take 1,000 mg by mouth 2 (two) times daily.       Marland Kitchen HYDROcodone-acetaminophen (HYCET) 7.5-325 mg/15 ml solution Take 10 mLs by mouth 4 (four) times daily as needed for moderate pain.  120 mL  0  . norethindrone (AYGESTIN) 5 MG tablet Take 5 mg by mouth daily.        No current facility-administered medications for this visit.     Allergies  Allergen Reactions  . Penicillins Hives    REVIEW OF SYSTEMS: Skin:  No history of rash.  No history of abnormal moles. Infection:  No history of hepatitis or HIV.  No history of MRSA. Neurologic:  No history of stroke.  No history of seizure.  No history of headaches. Cardiac:  HTN x 10 years. Pulmonary:  OSA x 10 years.  Dr. Alroy Dust in [place] follows her.  Endocrine:  Diabetic on oral hypoglycemics x 5 years.. On thyroid replacement x 20 years. Gastrointestinal:  No history of stomach disease.  No history of liver disease.  No history of gall bladder disease.  No history of pancreas disease. Colonoscopy in 2013 which was negative. Urologic:  No history of kidney stones.  No history of bladder infections. GYN:  Robotic hysterectomy 2013 for Stage 1 endometrial cancer.  Her husband's vasectomy did not work, so that is the reason for the trailing two children.  She has recently (last month) been started on hormone replacement because of hot flashes.  She stopped them as of a few days ago.   Musculoskeletal:  History of plantar fasciitis, which is behaving right now. Hematologic:  No bleeding disorder.  No history of anemia.  Not anticoagulated. Psycho-social:  The patient is oriented.   The patient has no obvious psychologic or social impairment to understanding our conversation and plan.  SOCIAL and FAMILY HISTORY: Married.  Her husband is with her. 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    PHYSICAL EXAM: BP 128/76  Pulse 77  Temp(Src) 98 F (36.7 C)   Resp 18  Ht 5' 6.5" (1.689 m)  Wt 275 lb 12.8 oz (125.102 kg)  BMI 43.85 kg/m2  General: Obese WF who is alert and generally healthy appearing.  HEENT: Normal. Pupils equal. Neck: Supple. No mass.  No thyroid mass. Lymph Nodes:  No supraclavicular or cervical nodes. Lungs: Clear to auscultation and symmetric breath sounds. Heart:  RRR.  No murmur or rub. Breast:  Right - slightly smaller than the left.  No mass  Left - no mass  Abdomen: Soft. No mass. No tenderness. No hernia. Obese.  Small umbilical hernia.  Midline scar just below the umbilicus and trocar scars right mid abdomen.   Rectal: Not done. Extremities:  Good strength and ROM  in upper and lower extremities. Neurologic:  Grossly intact to motor and sensory function. Psychiatric: Has normal mood and affect. Behavior is normal.   DATA REVIEWED: Labs from [place].  A/P:  Morbid obesity and DM;  Plan lapband placement at [facility].    Matt B. Hassell Done, MD, [facility] [phone] beeper [phone]  06/19/2013 12:52 PM

## 2013-07-02 NOTE — Anesthesia Preprocedure Evaluation (Signed)
Anesthesia Evaluation  Patient identified by MRN, date of birth, ID band Patient awake    Reviewed: Allergy & Precautions, H&P , NPO status , Patient's Chart, lab work & pertinent test results  Airway Mallampati: II TM Distance: >3 FB Neck ROM: Full    Dental  (+) Teeth Intact, Dental Advisory Given   Pulmonary sleep apnea and Continuous Positive Airway Pressure Ventilation ,  breath sounds clear to auscultation  Pulmonary exam normal       Cardiovascular hypertension, Pt. on medications negative cardio ROS  Rhythm:Regular Rate:Normal     Neuro/Psych negative neurological ROS  negative psych ROS   GI/Hepatic negative GI ROS, Neg liver ROS,   Endo/Other  diabetes, Type 2, Oral Hypoglycemic AgentsHypothyroidism Morbid obesity  Renal/GU negative Renal ROS  negative genitourinary   Musculoskeletal negative musculoskeletal ROS (+)   Abdominal (+) + obese,   Peds  Hematology negative hematology ROS (+) anemia ,   Anesthesia Other Findings   Reproductive/Obstetrics negative OB ROS                           Anesthesia Physical Anesthesia Plan  ASA: III  Anesthesia Plan: General   Post-op Pain Management:    Induction: Intravenous  Airway Management Planned: Oral ETT  Additional Equipment:   Intra-op Plan:   Post-operative Plan: Extubation in OR  Informed Consent: I have reviewed the patients History and Physical, chart, labs and discussed the procedure including the risks, benefits and alternatives for the proposed anesthesia with the patient or authorized representative who has indicated his/her understanding and acceptance.   Dental advisory given  Plan Discussed with: CRNA  Anesthesia Plan Comments:         Anesthesia Quick Evaluation

## 2013-07-02 NOTE — Anesthesia Postprocedure Evaluation (Signed)
Anesthesia Post Note  Patient: Ann Hendricks  Procedure(s) Performed: Procedure(s) (LRB): LAPAROSCOPIC GASTRIC BANDING (N/A)  Anesthesia type: General  Patient location: PACU  Post pain: Pain level controlled  Post assessment: Post-op Vital signs reviewed  Last Vitals:  Filed Vitals:   07/02/13 1315  BP: 147/77  Pulse: 78  Temp: 36.9 C  Resp: 16    Post vital signs: Reviewed  Level of consciousness: sedated  Complications: No apparent anesthesia complications

## 2013-07-02 NOTE — Op Note (Signed)
07/02/2013  Surgeon: Kaylyn Lim, MD, FACS Asst:  Alphonsa Overall, MD, FACS  Procedure: Laparoscopic adjustable gastric banding with APS system  Anes:  General  EBL:  Minimal  Description of Procedure  The patient was taken to OR # 1 and given general anesthesia.  After a prep with PCMX the patient was draped and a timeout performed.  Access to the abdomen was achieved with a 0 degree Optiview technique through the left upper quadrant.    Adhesions were minimal.  There was a small fascial defect at the umbilicus but no "outie" and this would be better treated after weight loss. .  Ports were placed to the the right of the midline including a 15 trocar in  the right upper quadrant placed obliquely.  The Sherrie Sport was used to retract the left lateral segment and the peritoneum was incised along the left crus.   The EJ junction as assessed for a hiatus hernia and no dimple was seen.  The patient denied GERD preop.  A balloon test was not performed.    The pars flaccida technique was utilized to insert the blunt "finger " dissector from right to left behind the stomach.  This created a target zone to pass the band passer.     The lapband APS  Had been previously flushed and was inserted through the 15 trocar.  It was placed in the tip of "the finger"  and pulled around behind the stomach.   The band was plicated with three sutures place with free needle and secured with Ty Knots.  The tubing was brought out through the lower incision on the right and connected to the port which had mesh sewn onto the back and was placed into the subcutaneous pocket.  The incisions were injected with Exparel and closed with 4-0 vicryl and Dermabond.     The patient was taken to the PACU in stable condition.    Matt B. Hassell Done, [place], [facility], [place]

## 2013-07-02 NOTE — Progress Notes (Signed)
CPAP setup for Pt use tonight, Current settings are 7 CMH20 per home settings via Pt home mask and tubing.  Pt stated that she would self administer when ready.  RT to monitor and assess as needed.

## 2013-07-02 NOTE — Transfer of Care (Signed)
Immediate Anesthesia Transfer of Care Note  Patient: Ann Hendricks  Procedure(s) Performed: Procedure(s): LAPAROSCOPIC GASTRIC BANDING (N/A)  Patient Location: PACU  Anesthesia Type:General  Level of Consciousness: awake, oriented, patient cooperative, lethargic and responds to stimulation  Airway & Oxygen Therapy: Patient Spontanous Breathing and Patient connected to face mask oxygen  Post-op Assessment: Report given to PACU RN, Post -op Vital signs reviewed and stable and Patient moving all extremities  Post vital signs: Reviewed and stable  Complications: No apparent anesthesia complications

## 2013-07-02 NOTE — Anesthesia Procedure Notes (Signed)
Procedure Name: Intubation Date/Time: 07/02/2013 10:09 AM Performed by: Ofilia Neas Pre-anesthesia Checklist: Patient identified, Timeout performed, Emergency Drugs available, Suction available and Patient being monitored Patient Re-evaluated:Patient Re-evaluated prior to inductionOxygen Delivery Method: Circle system utilized Preoxygenation: Pre-oxygenation with 100% oxygen Intubation Type: IV induction and Cricoid Pressure applied Ventilation: Mask ventilation without difficulty Laryngoscope Size: Mac and 4 Grade View: Grade IV Tube type: Glide rite Tube size: 7.5 mm Number of attempts: 3 Airway Equipment and Method: Video-laryngoscopy (one DL Mac 4 CRNA.  small epiglottis deep, anterior unable to vis cords. bougee passed  unable to place in trachea. second DL miller 3 Dr Winfred Leeds.  third with glidescope successful .  poor neck mobility, tight TMJ, small mouth large tongue.) Placement Confirmation: ETT inserted through vocal cords under direct vision,  positive ETCO2 and breath sounds checked- equal and bilateral Secured at: 21 cm Tube secured with: Tape Dental Injury: Teeth and Oropharynx as per pre-operative assessment  Difficulty Due To: Difficulty was anticipated, Difficult Airway- due to large tongue, Difficult Airway- due to reduced neck mobility, Difficult Airway- due to dentition, Difficult Airway- due to limited oral opening and Difficult Airway- due to anterior larynx Future Recommendations: Recommend- induction with short-acting agent, and alternative techniques readily available

## 2013-07-03 ENCOUNTER — Inpatient Hospital Stay (HOSPITAL_COMMUNITY): Payer: 59

## 2013-07-03 ENCOUNTER — Encounter (HOSPITAL_COMMUNITY): Payer: Self-pay | Admitting: Surgery

## 2013-07-03 LAB — CBC WITH DIFFERENTIAL/PLATELET
BASOS ABS: 0 10*3/uL (ref 0.0–0.1)
BASOS PCT: 0 % (ref 0–1)
EOS ABS: 0.1 10*3/uL (ref 0.0–0.7)
EOS PCT: 1 % (ref 0–5)
HCT: 39.5 % (ref 36.0–46.0)
Hemoglobin: 13.5 g/dL (ref 12.0–15.0)
LYMPHS PCT: 32 % (ref 12–46)
Lymphs Abs: 3.1 10*3/uL (ref 0.7–4.0)
MCH: 29.2 pg (ref 26.0–34.0)
MCHC: 34.2 g/dL (ref 30.0–36.0)
MCV: 85.3 fL (ref 78.0–100.0)
Monocytes Absolute: 0.7 10*3/uL (ref 0.1–1.0)
Monocytes Relative: 7 % (ref 3–12)
Neutro Abs: 6 10*3/uL (ref 1.7–7.7)
Neutrophils Relative %: 60 % (ref 43–77)
PLATELETS: 278 10*3/uL (ref 150–400)
RBC: 4.63 MIL/uL (ref 3.87–5.11)
RDW: 13.8 % (ref 11.5–15.5)
WBC: 9.9 10*3/uL (ref 4.0–10.5)

## 2013-07-03 NOTE — Discharge Instructions (Addendum)
° °                ° °ADJUSTABLE GASTRIC BAND ° Home Care Instructions ° ° These instructions are to help you care for yourself when you go home. ° °Call: If you have any problems. °• Call [phone] and ask for the surgeon on call °• If you need immediate assistance come to the ER at [place]. Tell the ER staff you are a new post-op gastric banding patient  °Signs and symptoms to report: • Severe  vomiting or nausea °o If you cannot handle clear liquids for longer than 1 day, call your surgeon °• Abdominal pain which does not get better after taking your pain medication °• Fever greater than 100.4°  F and chills °• Heart rate over 100 beats a minute °• Trouble breathing °• Chest pain °• Redness,  swelling, drainage, or foul odor at incision (surgical) sites °• If your incisions open or pull apart °• Swelling or pain in calf (lower leg) °• Diarrhea (Loose bowel movements that happen often), frequent watery, uncontrolled bowel movements °• Constipation, (no bowel movements for 3 days) if this happens: °o Take Milk of Magnesia, 2 tablespoons by mouth, 3 times a day for 2 days if needed °o Stop taking Milk of Magnesia once you have had a bowel movement °o Call your doctor if constipation continues °Or °o Take Miralax  (instead of Milk of Magnesia) following the label instructions °o Stop taking Miralax once you have had a bowel movement °o Call your doctor if constipation continues °• Anything you think is “abnormal for you” °  °Normal side effects after surgery: • Unable to sleep at night or unable to concentrate °• Irritability °• Being tearful (crying) or depressed ° °These are common complaints, possibly related to your anesthesia, stress of surgery, and change in lifestyle, that usually go away a few weeks after surgery. If these feelings continue, call your medical doctor.  °Wound Care: You may have surgical glue, steri-strips, or staples over your incisions after surgery °• Surgical glue: Looks like clear  film over your incisions and will wear off a little at a time °• Steri-strips: Adhesive strips of tape over your incisions. You may notice a yellowish color on skin under the steri-strips. This is used to make the steri-strips stick better. Do not pull the steri-strips off - let them fall off °• Staples: Staples may be removed before you leave the hospital °o If you go home with staples, call [facility] for an appointment with your surgeon’s nurse to have staples removed 10 days after surgery, [phone] °• Showering: You may shower two (2) days after your surgery unless your surgeon tells you differently °o Wash gently around incisions with warm soapy water, rinse well, and gently pat dry °o If you have a drain (tube from your incision), you may need someone to hold this while you shower °o No tub baths until staples are removed and incisions are healed °  °Medications: • Medications should be liquid or crushed if larger than the size of a dime °• Extended release pills (medication that releases a little bit at a time through the  day) should not be crushed °• Depending on the size and number of medications you take, you may need to space (take a few throughout the day)/change the time you take your medications so that you do not over-fill your pouch (smaller stomach) °• Make sure you follow-up with you primary care physician   to make medication changes needed during rapid weight loss and life -style changes °• If you have diabetes, follow up with your doctor that orders your diabetes medication(s) within one week after surgery and check your blood sugar regularly ° °• Do not drive while taking narcotics (pain medications) ° °• Do not take acetaminophen (Tylenol) and Roxicet or Lortab Elixir at the same time since these pain medications contain acetaminophen °  °Diet:  °First 2 Weeks You will see the nutritionist about two (2) weeks after your surgery. The nutritionist will increase the types of  foods you can eat if you are handling liquids well: °• If you have severe vomiting or nausea and cannot handle clear liquids lasting longer than 1 day call your surgeon °For Same Day Surgery Discharge Patients: °• The day of surgery drink water only: 2 ounces every 4 hours °• If you are handling water, start drinking your high protein shake the next morning °For Overnight Stay Patients: °• Begin by drinking 2 ounces of a high protein every 3 hours, 5-6 times per day °• Slowly increase the amount you drink as tolerated °• You may find it easier to slowly sip shakes throughout the day. It is important to get your proteins in first °  ° Protein Shake °• Drink at least 2 ounces of shake 5-6 times per day °• Each serving of protein shakes (usually 8-12 ounces) should have a minimum of: °o 15 grams of protein °o And no more than 5 grams of carbohydrate °• Goal for protein each day: °o Men = 80 grams per day °o Women = 60 grams per day °• Protein powder may be added to fluids such as non-fat milk or Lactaid milk or Soy milk (limit to 35 grams added protein powder per serving) ° °Hydration °• Slowly increase the amount of water and other clear liquids as tolerated (See Acceptable Fluids) °• Slowly increase the amount of protein shake as tolerated °• Sip fluids slowly and throughout the day °• May use sugar substitutes in small amounts (no more than 6-8 packets per day; i.e. Splenda) ° °Fluid Goal °• The first goal is to drink at least 8 ounces of protein shake/drink per day (or as directed by the nutritionist); some examples of protein shakes are Syntrax, Nectar, Adkins Advantage, EAS Edge HP, and Unjury. - See handout from pre-op Bariatric Education Class: °o Slowly increase the amount of protein shake you drink as tolerated °o You may find it easier to slowly sip shakes throughout the day °o It is important to get your proteins in first °• Your fluid goal is to drink 64-100 ounces of fluid daily °o It may take a few weeks  to build up to this  °• 32 oz. (or more) should be full liquids (see below for examples) °• Liquids should not contain sugar, caffeine, or carbonation ° °Clear Liquids: °• Water of Sugar-free flavored water (i.e. Fruit H²O, Propel) °• Decaffeinated coffee or tea (sugar-free) °• Crystal lite, Wyler’s Lite, Minute Maid Lite °• Sugar-free Jell-O °• Bouillon or broth °• Sugar-free Popsicle:    - Less than 20 calories each; Limit 1 per day ° ° ° ° °  ° Full Liquids: °                  Protein Shakes/Drinks + 2 choices per day of other full liquids °• Full liquids must be: °o No More Than 12 grams of Carbs per serving °o No More Than 3 grams   of Fat per serving °• Strained low-fat cream soup °• Non-Fat milk °• Fat-free Lactaid Milk °• Sugar-free yogurt (Dannon Lite & Fit, Greek yogurt) °  °Vitamins and Minerals • Start 1 day after surgery unless otherwise directed by your surgeon °• 1 Chewable Multivitamin / Multimineral Supplement with iron (i.e. Centrum for Adults) °• Chewable Calcium Citrate with Vitamin D-3 °(Example: 3 Chewable Calcium  Plus 600 with vitamin D-3) °o Take 500 mg three (3) times a day for a total of 1500 mg per day °o Do not take all 3 doses of calcium at one time as it may cause constipation, and you can only absorb 500 mg at a time °o Do not mix multivitamins containing iron with calcium supplements;  take 2 hours apart °o Do not substitute Tums (calcium carbonate) for your calcium °• Menstruating women and those at risk for anemia ( a blood disease that causes weakness) may need extra iron °o Talk to your doctor to see if you need more iron °• If you need extra iron: total daily iron recommendation (including Vitamins) is 50 to 100 mg Iron/day °• Do not stop taking or change any vitamins or minerals until you talk to your nutritionist or surgeon °• Your nutritionist and/or surgeon must approve all vitamin and mineral supplements  °Activity and Exercise: It is important to continue walking at home.  Limit your physical activity as instructed by your doctor. During this time, use these guidelines: °• Do not lift anything greater than ten  (10) pounds for at least two (2) weeks °• Do not go back to work or drive until your surgeon says you can °• You may have sex when you feel comfortable °o It is VERY important for female patients to use a reliable birth control method; fertility often increase after surgery °o Do not get pregnant for at least 18 months °• Start exercising as soon as your doctor tells you that you can °o Make sure your doctor approves any physical activity °• Start with a simple walking program °• Walk 5-15 minutes each day, 7 days per week °• Slowly increase until you are walking 30-45 minutes per day °• Consider joining our BELT program. [phone] or email [email or web address] ° °  °Special Instructions  Things to remember: °• Free counseling is available for you and your family through collaboration between [facility] and [employer]. Please call [phone] and leave a message °• Use your CPAP when sleeping if this applies to you °• Consider buying a medical alert bracelet that says you had lap-band surgery °• You will likely have your first fill (fluid added to your band) 6 - 8 weeks after surgery °• [facility] has a free Bariatric Surgery Support Group that meets monthly, the 3rd Thursday, 6pm. [facility]. You can see classes online at [email or web address] °• It is very important to keep all follow up appointments with your surgeon, nutritionist, primary care physician, and behavioral health practitioner °o After the first year, please follow up with your bariatric surgeon and nutritionist at least once a year in order to maintain best weight loss results °      °             [facility]:  [phone] ° °             [facility]: [phone] ° °             Bariatric Nurse Coordinator:   [phone] °  °   Adjustable Gastric Band Home Care Instructions  Rev. 02/2012                                                            ° °     Reviewed and Endorsed °                                                  by [place] [facility] Patient Education Committee, Jan, 2014 ° °

## 2013-07-03 NOTE — Progress Notes (Signed)
Pt discharged home with husband via private vehicle. IV dc'd. Discharge teaching reviewed and pt verbalized understanding.

## 2013-07-03 NOTE — Progress Notes (Signed)
Patient is alert and oriented.  Pain is controlled, and patient is tolerating fluids.  Plan to advance to protein shake today.  Reviewed Adjustable gastric band discharge instructions with patient, patient able to articulate understanding.  Provided information on BELT program, Support Group and [facility] outpatient pharmacy. All questions answered, will continue to monitor.  

## 2013-07-03 NOTE — Progress Notes (Signed)
Nutrition Education Note  Received consult for diet education per DROP protocol.   Discussed 2 week post op diet with pt. Emphasized that liquids must be non carbonated, non caffeinated, and sugar free. Fluid goals discussed. Pt to follow up with outpatient bariatric RD for further diet progression after 2 weeks. Multivitamins and minerals also reviewed. Teach back method used, pt expressed understanding, expect good compliance.   Diet: First 2 Weeks  You will see the nutritionist about two (2) weeks after your surgery. The nutritionist will increase the types of foods you can eat if you are handling liquids well:  If you have severe vomiting or nausea and cannot handle clear liquids lasting longer than 1 day, call your surgeon  Protein Shake  Drink at least 2 ounces of shake 5-6 times per day  Each serving of protein shakes (usually 8 - 12 ounces) should have a minimum of:  15 grams of protein  And no more than 5 grams of carbohydrate  Goal for protein each day:  Men = 80 grams per day  Women = 60 grams per day  Protein powder may be added to fluids such as non-fat milk or Lactaid milk or Soy milk (limit to 35 grams added protein powder per serving)   Hydration  Slowly increase the amount of water and other clear liquids as tolerated (See Acceptable Fluids)  Slowly increase the amount of protein shake as tolerated  Sip fluids slowly and throughout the day  May use sugar substitutes in small amounts (no more than 6 - 8 packets per day; i.e. Splenda)   Fluid Goal  The first goal is to drink at least 8 ounces of protein shake/drink per day (or as directed by the nutritionist); some examples of protein shakes are Syntrax Nectar, Adkins Advantage, EAS Edge HP, and Unjury. See handout from pre-op Bariatric Education Class:  Slowly increase the amount of protein shake you drink as tolerated  You may find it easier to slowly sip shakes throughout the day  It is important to get your proteins  in first  Your fluid goal is to drink 64 - 100 ounces of fluid daily  It may take a few weeks to build up to this  32 oz (or more) should be clear liquids  And  32 oz (or more) should be full liquids (see below for examples)  Liquids should not contain sugar, caffeine, or carbonation   Clear Liquids:  Water or Sugar-free flavored water (i.e. Fruit H2O, Propel)  Decaffeinated coffee or tea (sugar-free)  Crystal Lite, Wyler's Lite, Minute Maid Lite  Sugar-free Jell-O  Bouillon or broth  Sugar-free Popsicle: *Less than 20 calories each; Limit 1 per day   Full Liquids:  Protein Shakes/Drinks + 2 choices per day of other full liquids  Full liquids must be:  No More Than 12 grams of Carbs per serving  No More Than 3 grams of Fat per serving  Strained low-fat cream soup  Non-Fat milk  Fat-free Lactaid Milk  Sugar-free yogurt (Dannon Lite & Fit, Greek yogurt)     Heather Winkler MS, RD, LDN [phone] Pager [phone] Weekend/After Hours Pager  

## 2013-07-03 NOTE — Discharge Summary (Signed)
Physician Discharge Summary  Patient ID: Ann Hendricks MRN: [id number] DOB/AGE: [birth year] 53 y.o.  Admit date: 07/02/2013 Discharge date: 07/03/2013  Admission Diagnoses:  Obesity and DM  Discharge Diagnoses:  same  Active Problems:   Lapband APS June 2015   Status post gastric banding   Surgery:  Lapband APS placed  Discharged Condition: improved  Hospital Course:   Had lapband surgery.  Observed overnight and xray obtained which looked OK.  Ready for discharge back to [place]: none  Significant Diagnostic Studies: xray    Discharge Exam: Blood pressure 146/87, pulse 72, temperature 98.6 F (37 C), temperature source Oral, resp. rate 18, height 5\' 6"  (1.676 m), weight 265 lb 4 oz (120.317 kg), SpO2 95.00%. Incisions with possible topical allergy to Dermabond.    Disposition: Final discharge disposition not confirmed  Discharge Instructions   Discharge instructions    Complete by:  As directed   Follow bariatric dietary guidelines     Increase activity slowly    Complete by:  As directed      No wound care    Complete by:  As directed             Medication List         amLODipine 10 MG tablet  Commonly known as:  NORVASC  Take 10 mg by mouth every evening.     aspirin EC 81 MG tablet  Take 81 mg by mouth every evening.     aspirin-acetaminophen-caffeine 779-390-30 MG per tablet  Commonly known as:  EXCEDRIN MIGRAINE  Take 1 tablet by mouth every 6 (six) hours as needed for headache.     escitalopram 10 MG tablet  Commonly known as:  LEXAPRO  Take 10 mg by mouth every evening.     FREESTYLE INSULINX TEST test strip  Generic drug:  glucose blood     glyBURIDE micronized 3 MG tablet  Commonly known as:  GLYNASE  Take 3 mg by mouth 2 (two) times daily.     HYDROcodone-acetaminophen 7.5-325 mg/15 ml solution  Commonly known as:  HYCET  Take 10 mLs by mouth 4 (four) times daily as needed for moderate pain.     ibuprofen 200 MG  tablet  Commonly known as:  ADVIL,MOTRIN  Take 200 mg by mouth every 6 (six) hours as needed (Pain).     levothyroxine 200 MCG tablet  Commonly known as:  SYNTHROID, LEVOTHROID  Take 200 mcg by mouth every evening.     losartan 50 MG tablet  Commonly known as:  COZAAR  Take 50 mg by mouth every evening.     metFORMIN 500 MG tablet  Commonly known as:  GLUCOPHAGE  Take 1,000 mg by mouth 2 (two) times daily.     norethindrone 5 MG tablet  Commonly known as:  AYGESTIN  Take 5 mg by mouth daily.           Follow-up Information   Follow up with Pedro Earls, MD.   Specialty:  General Surgery   Contact information:   [address] [place] [address] [phone]       Signed: Pedro Earls 07/03/2013, 2:13 PM

## 2013-07-16 ENCOUNTER — Encounter: Payer: 59 | Attending: Surgery

## 2013-07-16 DIAGNOSIS — Z713 Dietary counseling and surveillance: Secondary | ICD-10-CM | POA: Insufficient documentation

## 2013-07-16 NOTE — Patient Instructions (Signed)
Patient to follow Phase 3A-Soft, High Protein Diet and follow-up at [facility] in 6 weeks for 2 months post-op nutrition visit for diet advancement. 

## 2013-07-16 NOTE — Progress Notes (Signed)
Bariatric Class:  Appt start time: 1530 end time:  1630.  2 Week Post-Operative Nutrition Class  Patient was seen on 07/16/2013 for Post-Operative Nutrition [facility].  Surgery date: 07/02/2013 Surgery type: LAGB Start weight at [facility]: 280.5 lbs Weight today: 259.5 lbs Weight loss: 19.5 lbs  TANITA  BODY COMP RESULTS  06/17/2013 07/16/13   BMI (kg/m^2) 44.4 41.3   Fat Mass (lbs) 138.5 126.0   Fat Free Mass (lbs) 140.5 133.5   Total Body Water (lbs) 103 97.5    The following the learning objectives were met by the patient during this course:  Identifies Phase 3A (Soft, High Proteins) Dietary Goals and will begin from 2 weeks post-operatively to 2 months post-operatively  Identifies appropriate sources of fluids and proteins   States protein recommendations and appropriate sources post-operatively  Identifies the need for appropriate texture modifications, mastication, and bite sizes when consuming solids  Identifies appropriate multivitamin and calcium sources post-operatively  Describes the need for physical activity post-operatively and will follow MD recommendations  States when to call healthcare provider regarding medication questions or post-operative complications  Handouts given during class include:  Phase 3A: Soft, High Protein Diet Handout  Band Fill Guidelines Handout  Follow-Up Plan: Patient will follow-up at [facility] in 4 weeks for 6 week post-op nutrition visit for diet advancement per MD.

## 2013-07-22 ENCOUNTER — Telehealth (HOSPITAL_COMMUNITY): Payer: Self-pay

## 2013-07-22 NOTE — Telephone Encounter (Signed)
Attempt to make DROP Discharge phone call, left message for patient to return call, no call back recieved

## 2013-07-23 ENCOUNTER — Ambulatory Visit (INDEPENDENT_AMBULATORY_CARE_PROVIDER_SITE_OTHER): Payer: 59 | Admitting: Surgery

## 2013-07-23 VITALS — BP 138/78 | HR 70 | Ht 66.0 in | Wt 257.0 lb

## 2013-07-23 DIAGNOSIS — Z4651 Encounter for fitting and adjustment of gastric lap band: Secondary | ICD-10-CM

## 2013-07-23 NOTE — Patient Instructions (Signed)

## 2013-07-23 NOTE — Progress Notes (Signed)
Lapband Fill Encounter Problem List:   Patient Active Problem List   Diagnosis Date Noted  . Lapband APS June 2015 07/02/2013  . Status post gastric banding 07/02/2013  . Diabetes 06/19/2013  . Unspecified hypothyroidism 06/19/2013  . Ventral hernia 06/19/2013  . Morbid obesity, weight 282, BMI 44.8 02/08/2013    Ann Hendricks Body mass index is 41.5 kg/(m^2). Weight loss since surgery  18.8 lbs  Having regurgitation?:  no  Feel that they need a fill?  3 weeks postop and starting to get hungry  Nocturnal reflux?  no  Amount of fill  0.75     Instructions given and weight loss goals discussed.    She is from [place], [place].  Will do another fill in 3 weeks.    Matt B. Hassell Done, MD, FACS

## 2013-07-29 ENCOUNTER — Encounter (INDEPENDENT_AMBULATORY_CARE_PROVIDER_SITE_OTHER): Payer: Self-pay | Admitting: Surgery

## 2013-08-16 ENCOUNTER — Encounter: Payer: 59 | Attending: Surgery | Admitting: Dietician

## 2013-08-16 DIAGNOSIS — Z713 Dietary counseling and surveillance: Secondary | ICD-10-CM | POA: Diagnosis not present

## 2013-08-16 NOTE — Patient Instructions (Addendum)
Goals:  Follow Phase 3B: High Protein + Non-Starchy Vegetables  Eat 3-6 small meals/snacks, every 3-5 hrs  Increase lean protein foods to meet 60g goal  Increase fluid intake to 64oz +  Avoid drinking 15 minutes before, during and 30 minutes after eating  Aim for >30 min of physical activity daily  Find something to distract you from munching  Remove junk foods from the house  Work on eating slowly  Work on getting enough Calcium   TANITA  BODY COMP RESULTS  06/17/2013 07/16/13 08/16/13   BMI (kg/m^2) 44.4 41.3 40.5   Fat Mass (lbs) 138.5 126.0 123   Fat Free Mass (lbs) 140.5 133.5 132   Total Body Water (lbs) 103 97.5 96.5

## 2013-08-16 NOTE — Progress Notes (Signed)
  Follow-up visit:  6 Weeks Post-Operative LAGB Surgery  Medical Nutrition Therapy:  Appt start time: 1000 end time:  1030.  Primary concerns today: Post-operative Bariatric Surgery Nutrition Management. Ann Hendricks is here today with her husband. She reports they were on vacation in [place] last week and went out to eat a lot. Ann Hendricks returned home discouraged because she had gained 3 pounds. She reports tolerating all foods. Had 1/2 biscuit yesterday. Ann Hendricks states that she feels like she wants to eat when she sees trigger foods, regardless of hunger.  Surgery date: 07/02/2013 Surgery type: LAGB Start weight at [facility]: 280.5 lbs Weight today: 259.5 lbs Weight change:  Total weight loss: 21 lbs   TANITA  BODY COMP RESULTS  06/17/2013 07/16/13 08/16/13   BMI (kg/m^2) 44.4 41.3 40.5   Fat Mass (lbs) 138.5 126.0 123   Fat Free Mass (lbs) 140.5 133.5 132   Total Body Water (lbs) 103 97.5 96.5    Preferred Learning Style:  No preference indicated   Learning Readiness:  Ready  24-hr recall: B ( AM): 2 egg omelet with ham/sausage and cheese (split between breakfast and lunch) (14g) Snk (AM): sometimes 1-2 cheese sticks or yogurt (12g)  L (PM): Unjury protein shake with skim milk (34g) Snk (PM):   D (PM): 2-3 oz rotisserie chicken with fat free refried beans with salsa (14-21g) Snk (PM):   Fluid intake: protein shake, unsweet tea, water with sugar free flavoring (50-64 oz) Estimated total protein intake: 75g+ per day  Medications: reduced BP medicine, no more diabetes meds Supplementation: having trouble getting all 3 Calcium  CBG monitoring: 1-2x a day Average CBG per patient: 130 mg/dL in the morning Last patient reported A1c: no new A1c  Using straws: rarely Drinking while eating: sometimes sips Hair loss: no Carbonated beverages: no N/V/D/C: constipation Last Lap-Band fill: 0.75 cc on 07/23/13  Recent physical activity:  Moving more in general  Progress Towards Goal(s):  In  progress.  Handouts given during visit include:  Phase 3B lean protein + non-starchy vegetables   Nutritional Diagnosis:  [place]-3.3 Overweight/obesity related to past poor dietary habits and physical inactivity as evidenced by patient w/ recent LAGB surgery following dietary guidelines for continued weight loss.     Intervention:  Nutrition counseling provided.  Teaching Method Utilized:  Visual Auditory  Barriers to learning/adherence to lifestyle change: food cravings  Demonstrated degree of understanding via:  Teach Back   Monitoring/Evaluation:  Dietary intake, exercise, lap band fills, and body weight. Follow up in 1 months for 3 month post-op visit.

## 2013-08-22 ENCOUNTER — Encounter (INDEPENDENT_AMBULATORY_CARE_PROVIDER_SITE_OTHER): Payer: 59 | Admitting: Surgery

## 2013-08-29 ENCOUNTER — Encounter (INDEPENDENT_AMBULATORY_CARE_PROVIDER_SITE_OTHER): Payer: Self-pay

## 2013-08-29 ENCOUNTER — Ambulatory Visit (INDEPENDENT_AMBULATORY_CARE_PROVIDER_SITE_OTHER): Payer: 59 | Admitting: Physician Assistant

## 2013-08-29 VITALS — BP 140/80 | HR 64 | Temp 98.7°F | Resp 14 | Ht 66.5 in | Wt 250.7 lb

## 2013-08-29 DIAGNOSIS — Z4651 Encounter for fitting and adjustment of gastric lap band: Secondary | ICD-10-CM

## 2013-08-29 NOTE — Patient Instructions (Signed)

## 2013-08-29 NOTE — Progress Notes (Signed)
  HISTORY: Ann Hendricks is a 53 y.o.female who received an AP-Standard lap-band in June 2015 by Dr. Hassell Done. The patient has lost six lbs since their last visit in July, and has lost 25 lbs since surgery. She comes in having done well since her last appointment. She wanted to lose more weight but she had been on vacation over the last month and her son has just returned to college so she wasn't on her regular schedule. She has had no issues with swallowing. She has noticed that her tolerance of larger portions has increased - she was unable to eat an entire chicken breast but is able to do so now. She is resuming her exercise on her elliptical machine.  VITAL SIGNS: Filed Vitals:   08/29/13 1121  BP: 140/80  Pulse: 64  Temp: 98.7 F (37.1 C)  Resp: 14    PHYSICAL EXAM: Physical exam reveals a very well-appearing 53 y.o.female in no apparent distress Neurologic: Awake, alert, oriented Psych: Bright affect, conversant Respiratory: Breathing even and unlabored. No stridor or wheezing Abdomen: Soft, nontender, nondistended to palpation. Incisions well-healed. No incisional hernias. Port easily palpated. Extremities: Atraumatic, good range of motion.  ASSESMENT: 53 y.o.  female  s/p AP-Standard lap-band.   PLAN: The patient's port was accessed with a 20G Huber needle without difficulty. Clear fluid was aspirated and 0.5 mL saline was added to the port. The patient was able to swallow water without difficulty following the procedure and was instructed to take clear liquids for the next 24-48 hours and advance slowly as tolerated. Dr. Hassell Done will see her in September and I will see her in October.

## 2013-09-26 ENCOUNTER — Encounter: Payer: 59 | Attending: Surgery | Admitting: Dietician

## 2013-09-26 ENCOUNTER — Encounter (INDEPENDENT_AMBULATORY_CARE_PROVIDER_SITE_OTHER): Payer: 59 | Admitting: Surgery

## 2013-09-26 DIAGNOSIS — Z713 Dietary counseling and surveillance: Secondary | ICD-10-CM | POA: Insufficient documentation

## 2013-09-26 NOTE — Progress Notes (Signed)
  Follow-up visit:  3 months Post-Operative LAGB Surgery  Medical Nutrition Therapy:  Appt start time: 210 end time:  240.  Primary concerns today: Post-operative Bariatric Surgery Nutrition Management.  Ann Hendricks returns today having lost 3 pounds of fat. She is disappointed that her weight loss has slowed but moving forward would like to lose 1 pound a week. Saw Dr. Hassell Done today and got band fill of 0.7 cc. She reports that she has mixed nuts if craving salt.  Surgery date: 07/02/2013 Surgery type: LAGB Start weight at [facility]: 280.5 lbs (289 per patient) Weight today: 259.5 lbs Weight change: 13.5 lbs Total weight loss: 42.5 lbs   TANITA  BODY COMP RESULTS  06/17/2013 07/16/13 08/16/13 09/26/13   BMI (kg/m^2) 44.4 41.3 40.5 39.2   Fat Mass (lbs) 138.5 126.0 123 117   Fat Free Mass (lbs) 140.5 133.5 132 129.5   Total Body Water (lbs) 103 97.5 96.5 95    Preferred Learning Style:  No preference indicated   Learning Readiness:  Ready  24-hr recall: B ( AM): 1 egg omelet with ham/sausage and cheese (split between breakfast and lunch) and Dannon Light and Fit yogurt and occasionally 1 piece low carb toast (12-14g) Snk (AM): L (PM): sometimes skips and has yogurt or Unjury protein shake with skim milk (12-34g) Snk (PM):   D (PM): 3-4 oz rotisserie chicken and cream of chicken soup, corn (21-28g)  Snk (PM): none usually  Fluid intake: protein shake, unsweet tea, water with sugar free flavoring (50-64 oz) Estimated total protein intake: 60g+ per day  Medications: reduced BP medicine, no more diabetes meds (scheduled to follow up with PCP next month for med management) Supplementation: inconsistent  CBG monitoring: 1x a day Average CBG per patient: 135-160 mg/dL in the morning Last patient reported A1c: A1c planned next month  Using straws: rarely Drinking while eating: sometimes sips Hair loss: yes Carbonated beverages: no N/V/D/C: constipation Last Lap-Band fill: 0.7 cc  today  Recent physical activity:  Moving more in general; inconsistent  Progress Towards Goal(s):  In progress.  Handouts given during visit include:  Phase 3B lean protein + non-starchy vegetables   Nutritional Diagnosis:  [place]-3.3 Overweight/obesity related to past poor dietary habits and physical inactivity as evidenced by patient w/ recent LAGB surgery following dietary guidelines for continued weight loss.     Intervention:  Nutrition counseling provided.  Teaching Method Utilized:  Visual Auditory  Barriers to learning/adherence to lifestyle change: food cravings  Demonstrated degree of understanding via:  Teach Back   Monitoring/Evaluation:  Dietary intake, exercise, lap band fills, and body weight. Follow up in 6 weeks for 5.5 month post-op visit.

## 2013-09-26 NOTE — Patient Instructions (Addendum)
Goals:  Follow Phase 3B: High Protein + Non-Starchy Vegetables  Eat 3-6 small meals/snacks, every 3-5 hrs  Increase lean protein foods to meet 60g goal  Increase fluid intake to 64oz +  Avoid drinking 15 minutes before, during and 30 minutes after eating  Aim for >30 min of physical activity daily  Find something to distract you from munching  Remove junk foods from the house  Work on eating slowly  Work on getting enough Calcium (try chews from either Celebrate or Bariatric Advantage)  Try having a small snack before bed  Try Biotin for hair loss   TANITA  BODY COMP RESULTS  06/17/2013 07/16/13 08/16/13 09/26/13   BMI (kg/m^2) 44.4 41.3 40.5 39.2   Fat Mass (lbs) 138.5 126.0 123 117   Fat Free Mass (lbs) 140.5 133.5 132 129.5   Total Body Water (lbs) 103 97.5 96.5 95

## 2013-10-24 ENCOUNTER — Encounter (INDEPENDENT_AMBULATORY_CARE_PROVIDER_SITE_OTHER): Payer: 59

## 2013-11-07 ENCOUNTER — Encounter: Payer: 59 | Attending: Surgery | Admitting: Dietician

## 2013-11-07 DIAGNOSIS — Z6839 Body mass index (BMI) 39.0-39.9, adult: Secondary | ICD-10-CM | POA: Diagnosis not present

## 2013-11-07 DIAGNOSIS — Z713 Dietary counseling and surveillance: Secondary | ICD-10-CM | POA: Insufficient documentation

## 2013-11-07 NOTE — Progress Notes (Signed)
  Follow-up visit:  4 months Post-Operative LAGB Surgery  Medical Nutrition Therapy:  Appt start time: 1210 end time:  1240  Primary concerns today: Post-operative Bariatric Surgery Nutrition Management.  Ann Hendricks returns today having maintained her weight. She reports she has been travelling a lot lately and having difficulty following consistent diet. Just reinstalled My Fitness Pal and has been recording intake. Just had a band fill today, 0.5 cc. Previously has "not felt any restriction" and can tolerate any food including bread.    Surgery date: 07/02/2013 Surgery type: LAGB [facility] at [facility]: 280.5 lbs (289 per patient) Weight today: 248.5 lbs Weight change: 1 lbs Total weight loss: 40.5 lbs   TANITA  BODY COMP RESULTS  06/17/2013 07/16/13 08/16/13 09/26/13 11/07/13   BMI (kg/m^2) 44.4 41.3 40.5 39.2 39.5   Fat Mass (lbs) 138.5 126.0 123 117 119   Fat Free Mass (lbs) 140.5 133.5 132 129.5 129.5   Total Body Water (lbs) 103 97.5 96.5 95 95    Preferred Learning Style:  No preference indicated   Learning Readiness:  Ready  24-hr recall:  Having at least 1 protein shake a day. "Eats primarily meat, beans, and cheese."  B ( AM): 2 eggs with cheese and meat Snk (AM): L (PM): sometimes skips, just has a snack or 2 Snk (PM):   D (PM): meat and vegetable Protein shake Snk (PM): none usually  Fluid intake: protein shake, unsweet tea, water with sugar free flavoring (50-64 oz) Estimated total protein intake: 60g+ per day  Medications: Norvasc and synthroid Supplementation: "doing better" and taking Calcium more consistently  CBG monitoring: 1x a day Average CBG per patient: 135-160 mg/dL in the morning Last patient reported A1c: 6.7%  Using straws: rarely Drinking while eating: sometimes sips Hair loss: yes (taking Biotin) Carbonated beverages: no N/V/D/C: constipation, takes Dulcolax or Miralax Last Lap-Band fill: 0.5 cc today  Recent physical activity:  sporadic  Progress Towards Goal(s):  In progress.  Handouts given during visit include:  Bariatric fast food guide   Nutritional Diagnosis:  [place]-3.3 Overweight/obesity related to past poor dietary habits and physical inactivity as evidenced by patient w/ recent LAGB surgery following dietary guidelines for continued weight loss.     Intervention:  Nutrition counseling provided.  Teaching Method Utilized:  Visual Auditory  Barriers to learning/adherence to lifestyle change: food cravings  Demonstrated degree of understanding via:  Teach Back   Monitoring/Evaluation:  Dietary intake, exercise, lap band fills, and body weight. Follow up prn. Patient to call [facility].

## 2013-11-07 NOTE — Patient Instructions (Addendum)
-  Keep healthy snacks on hand while traveling -Limit carbs like bread and crackers -Increase physical activity -Try Quest protein chips

## 2015-02-13 IMAGING — CR DG ABDOMEN 1V
2 series · 2 of 2 positions shown · non-contrast
Comparison: None.

CLINICAL DATA: Status post gastric lap band placement

EXAM:
ABDOMEN - 1 VIEW

[t abdomen supine (1 of 2)]
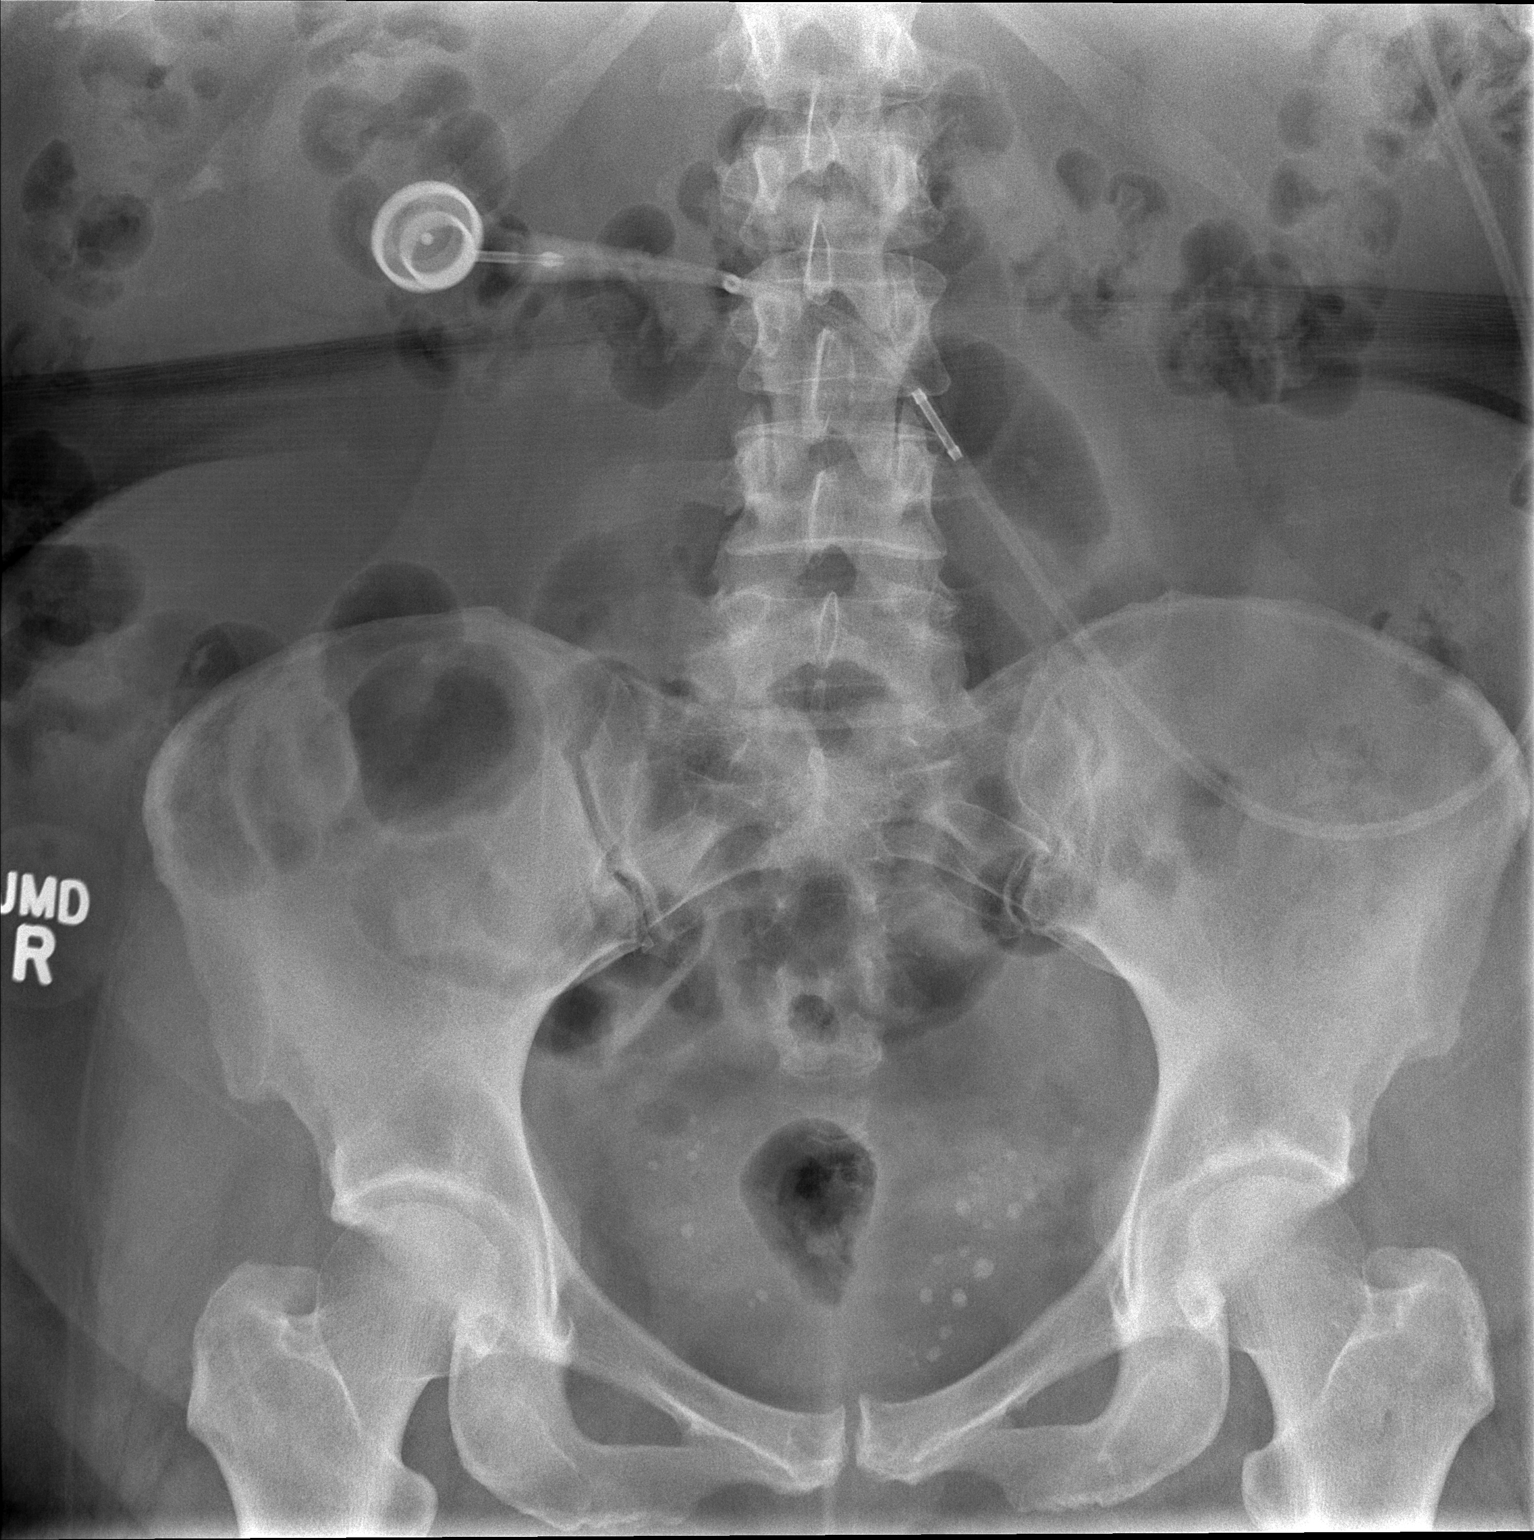

[t abdomen supine (2 of 2)]
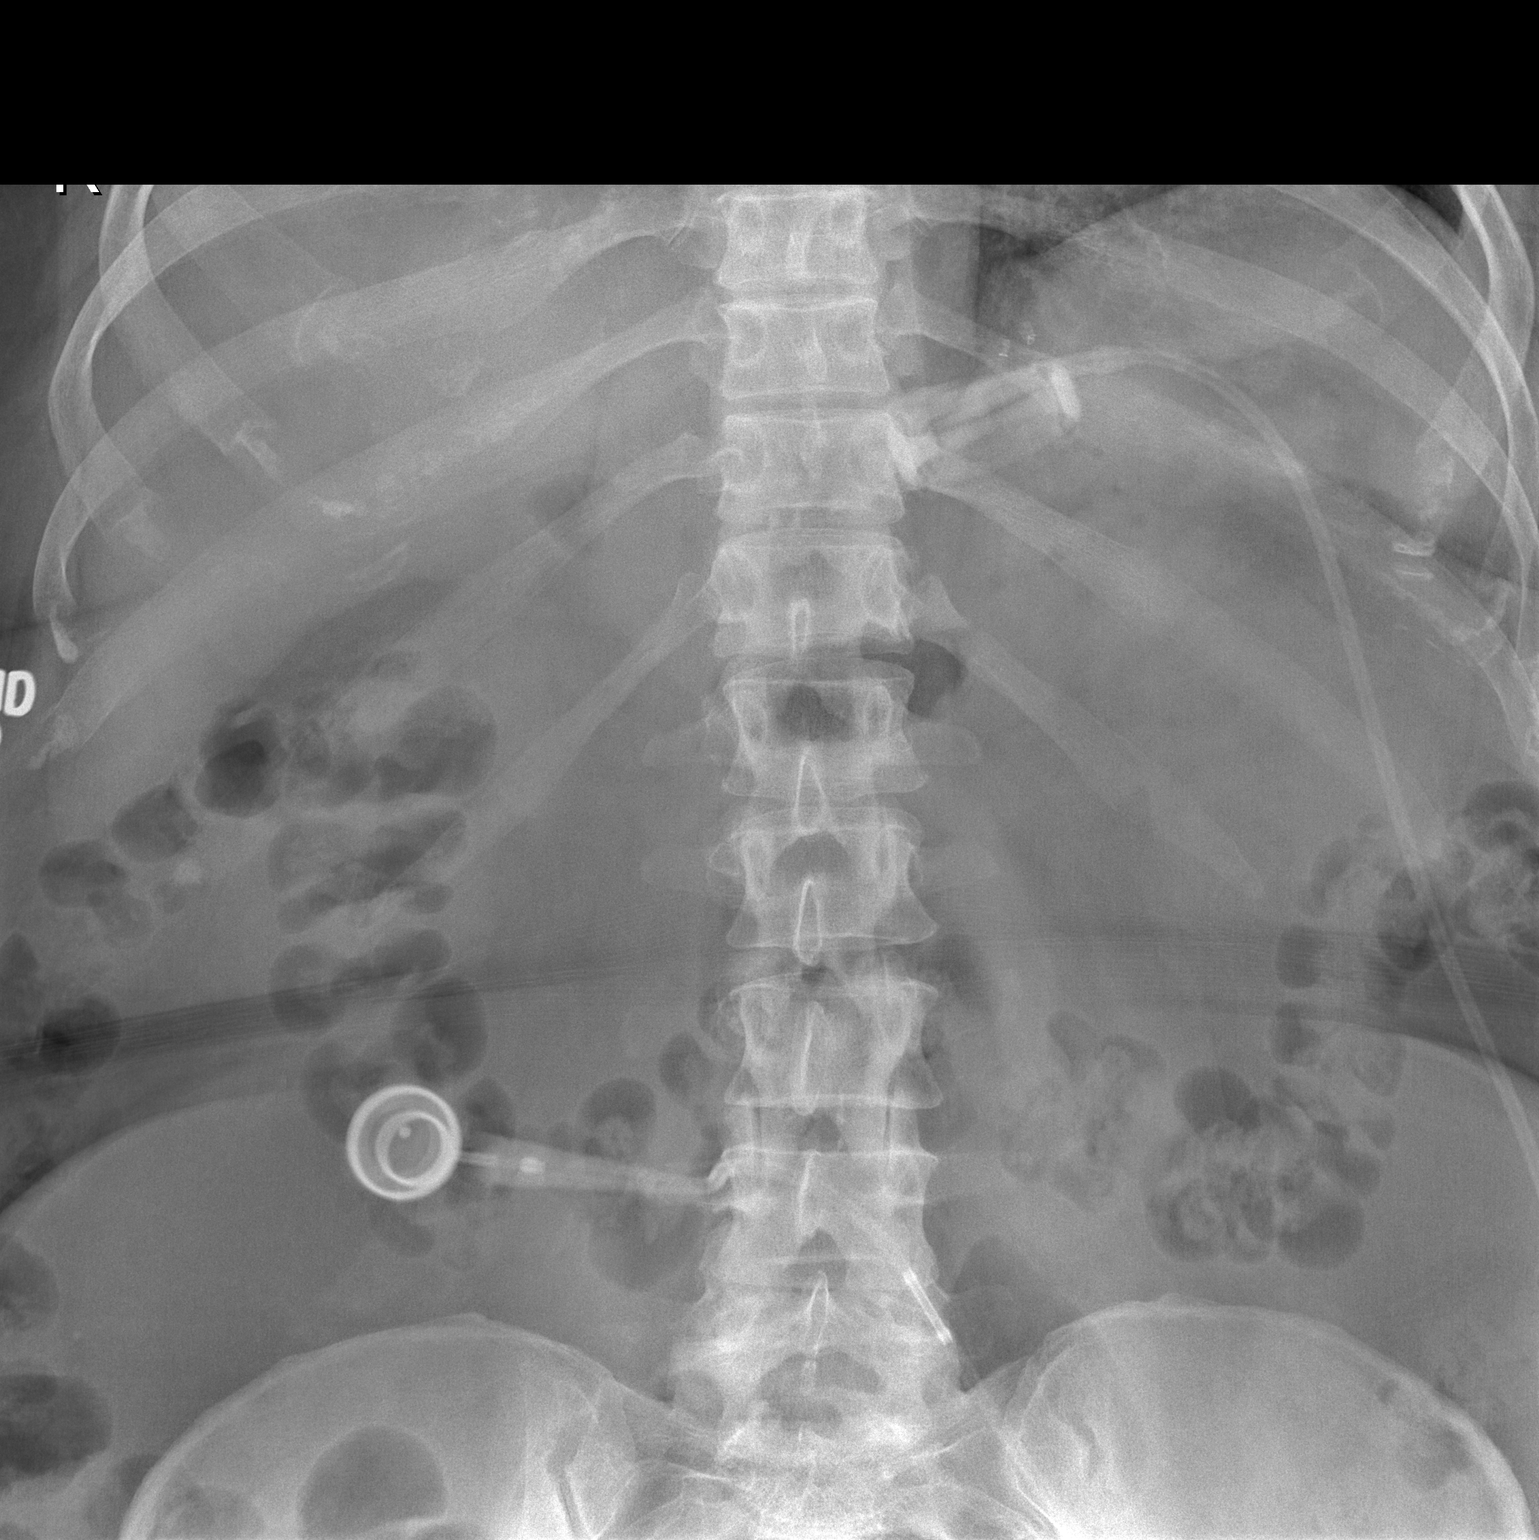

[2 of 2 positions shown; findings below may reference images not displayed]

FINDINGS: Bowel gas pattern is within normal limits. A gastric lap band is now
seen. The catheter appears intact although its most lateral inferior
aspect is not well visualized on these images. The band has a
somewhat horizontal position directed at the 2 and 8 o'clock
position. No other focal abnormality is seen.
IMPRESSION: Gastric lap band as described. This will serve as a baseline for
subsequent follow-up examinations as necessary.

## 2017-01-24 ENCOUNTER — Encounter (HOSPITAL_COMMUNITY): Payer: Self-pay

## 2017-07-20 ENCOUNTER — Encounter: Payer: Self-pay | Admitting: Registered"

## 2017-07-20 ENCOUNTER — Encounter: Payer: BLUE CROSS/BLUE SHIELD | Attending: Surgery | Admitting: Registered"

## 2017-07-20 DIAGNOSIS — Z4651 Encounter for fitting and adjustment of gastric lap band: Secondary | ICD-10-CM | POA: Insufficient documentation

## 2017-07-20 DIAGNOSIS — E119 Type 2 diabetes mellitus without complications: Secondary | ICD-10-CM

## 2017-07-20 DIAGNOSIS — Z713 Dietary counseling and surveillance: Secondary | ICD-10-CM | POA: Insufficient documentation

## 2017-07-20 NOTE — Patient Instructions (Addendum)
-   Aim to eat protein, non-starchy vegetables, and then carbohydrates.   - Listen to body for hunger and satisfaction cues.   - Keep snack bag handy especially when running errands.   - Aim to increase physical activity 30 min, 3 days/week.  - Look into a mental health professional.

## 2017-07-20 NOTE — Progress Notes (Signed)
Follow-up visit:  4 Years Post-Operative LAGB Surgery  Medical Nutrition Therapy:  Appt start time: 2:40 end time:  3:35.  Primary concerns today: Post-operative Bariatric Surgery Nutrition Management.  Non scale victories: none stated  Surgery date: 07/02/2013 Surgery type: LAGB Start weight at [facility]: 280.5 lbs (289 per patient) Weight today: 239.6 lbs Weight change: 8.9 lbs loss from 248.5 (11/07/2013) Total weight loss: 40.9 lbs (40.5 lbs previous visit) Weight loss goal: to improve knee pain and back pain   TANITA  BODY COMP RESULTS  06/17/2013 07/16/13 08/16/13 09/26/13 11/07/13 07/20/2017   BMI (kg/m^2) 44.4 41.3 40.5 39.2 39.5 38.1   Fat Mass (lbs) 138.5 126.0 123 117 119 111.6   Fat Free Mass (lbs) 140.5 133.5 132 129.5 129.5 128.0   Total Body Water (lbs) 103 97.5 96.5 95 95 93.0   Pt arrives with husband. Pt states she had breast cancer in 2017 and she stopeed focusing on weight loss at that point. Pt states she is an emotional eater. Pt states started her first diet at age 36 and has been dieting every since. Pt states she needs guidelines on how to get back on track.   Pt states she understands that everybody's body is different and health is not connected with weight. Pt states she knows this most days but needs to allow her actions to follow-through with this.   Pt states she has been stressed lately due to taking care of mother-in-law and daughter moving to another state.    Preferred Learning Style:   No preference indicated   Learning Readiness:   Ready  Change in progress  24-hr recall: B (10 AM): 2 eggs, 3 slices of bacon, some fries, 1-1.5 toast with jelly Snk (AM): none  L (PM): typically skips; BLT sandwich, 2 onion rings Snk (PM): sometimes peanut butter crackers  D (5-5:30PM): taco salad (beef, black beans, corn, salsa, cheese), fritos Snk (PM): cake  Fluid intake: half and half tea, water with flavor, [employer] 2% milk,  water; 64+ oz  Estimated total  protein intake: ~45 grams  Medications: See list Supplementation: no multivitamin, Vitamin D, Vitamin B12, potassium, calcium (sometimes), magnesium  CBG monitoring: yes Average CBG per patient: 1x/day: FBS (160) Last patient reported A1c: 6.8 (06/2017)  Last Lap-Band fill: 07/11 (0.5 cc)  Recent physical activity: none stated; averages 5000-8000 steps/day  Progress Towards Goal(s):  In progress.  Handouts given during visit include:  Pre-Op Goals   Nutritional Diagnosis:  [place]-3.3 Overweight/obesity related to past poor dietary habits and physical inactivity as evidenced by patient w/ recent LAGB surgery following dietary guidelines for continued weight loss.     Intervention:  Nutrition education and counseling. Pt was educated and counseled on habits to get back on track with chewing, listening to her body, meal strategies, making healthy food choices, mental health, and the importance of physical activity. Pt was in agreement with goals listed.  Goals: - Aim to eat protein, non-starchy vegetables, and then carbohydrates.  - Listen to body for hunger and satisfaction cues.  - Keep snack bag handy especially when running errands.  - Aim to increase physical activity 30 min, 3 days/week. - Look into a mental health professional.   Teaching Method Utilized:  Visual Auditory Hands on  Barriers to learning/adherence to lifestyle change: none identified  Demonstrated degree of understanding via:  Teach Back   Monitoring/Evaluation:  Dietary intake, exercise, lap band fills, and body weight. Follow up prn.
